# Patient Record
Sex: Female | Born: 1975 | Race: Black or African American | Hispanic: No | Marital: Single | Smoking: Former smoker
Health system: Southern US, Community
[De-identification: ages and names within clinical notes are randomized; demographics above are authoritative.]

## PROBLEM LIST (undated history)

## (undated) DIAGNOSIS — E669 Obesity, unspecified: Secondary | ICD-10-CM

## (undated) DIAGNOSIS — Z5189 Encounter for other specified aftercare: Secondary | ICD-10-CM

## (undated) DIAGNOSIS — J45909 Unspecified asthma, uncomplicated: Secondary | ICD-10-CM

## (undated) DIAGNOSIS — R42 Dizziness and giddiness: Secondary | ICD-10-CM

## (undated) DIAGNOSIS — M79604 Pain in right leg: Secondary | ICD-10-CM

## (undated) DIAGNOSIS — J189 Pneumonia, unspecified organism: Secondary | ICD-10-CM

## (undated) DIAGNOSIS — K219 Gastro-esophageal reflux disease without esophagitis: Secondary | ICD-10-CM

## (undated) DIAGNOSIS — T7840XA Allergy, unspecified, initial encounter: Secondary | ICD-10-CM

## (undated) DIAGNOSIS — J069 Acute upper respiratory infection, unspecified: Secondary | ICD-10-CM

## (undated) DIAGNOSIS — M1990 Unspecified osteoarthritis, unspecified site: Secondary | ICD-10-CM

## (undated) HISTORY — PX: FRACTURE SURGERY: SHX138

## (undated) HISTORY — DX: Unspecified asthma, uncomplicated: J45.909

## (undated) HISTORY — DX: Encounter for other specified aftercare: Z51.89

## (undated) HISTORY — DX: Gastro-esophageal reflux disease without esophagitis: K21.9

## (undated) HISTORY — DX: Unspecified osteoarthritis, unspecified site: M19.90

## (undated) HISTORY — DX: Dizziness and giddiness: R42

## (undated) HISTORY — DX: Obesity, unspecified: E66.9

## (undated) HISTORY — DX: Allergy, unspecified, initial encounter: T78.40XA

## (undated) HISTORY — DX: Acute upper respiratory infection, unspecified: J06.9

## (undated) HISTORY — DX: Pain in right leg: M79.604

## (undated) HISTORY — PX: ADENOIDECTOMY: SUR15

---

## 1996-04-15 HISTORY — PX: HARDWARE REMOVAL: SHX979

## 1997-08-02 ENCOUNTER — Ambulatory Visit (HOSPITAL_COMMUNITY): Admission: RE | Admit: 1997-08-02 | Discharge: 1997-08-02 | Payer: Self-pay | Admitting: Obstetrics

## 1997-08-16 ENCOUNTER — Ambulatory Visit (HOSPITAL_COMMUNITY): Admission: RE | Admit: 1997-08-16 | Discharge: 1997-08-16 | Payer: Self-pay | Admitting: Obstetrics

## 1997-10-13 ENCOUNTER — Other Ambulatory Visit: Admission: RE | Admit: 1997-10-13 | Discharge: 1997-10-13 | Payer: Self-pay | Admitting: Obstetrics

## 1997-10-28 ENCOUNTER — Inpatient Hospital Stay (HOSPITAL_COMMUNITY): Admission: AD | Admit: 1997-10-28 | Discharge: 1997-10-31 | Payer: Self-pay | Admitting: Obstetrics

## 1997-10-28 ENCOUNTER — Inpatient Hospital Stay (HOSPITAL_COMMUNITY): Admission: AD | Admit: 1997-10-28 | Discharge: 1997-10-28 | Payer: Self-pay | Admitting: Obstetrics

## 1999-05-09 ENCOUNTER — Other Ambulatory Visit: Admission: RE | Admit: 1999-05-09 | Discharge: 1999-05-09 | Payer: Self-pay | Admitting: Obstetrics & Gynecology

## 1999-11-13 ENCOUNTER — Inpatient Hospital Stay (HOSPITAL_COMMUNITY): Admission: AD | Admit: 1999-11-13 | Discharge: 1999-11-15 | Payer: Self-pay | Admitting: Obstetrics and Gynecology

## 1999-11-13 ENCOUNTER — Inpatient Hospital Stay (HOSPITAL_COMMUNITY): Admission: AD | Admit: 1999-11-13 | Discharge: 1999-11-13 | Payer: Self-pay | Admitting: Obstetrics & Gynecology

## 2002-01-27 ENCOUNTER — Other Ambulatory Visit: Admission: RE | Admit: 2002-01-27 | Discharge: 2002-01-27 | Payer: Self-pay | Admitting: Obstetrics and Gynecology

## 2003-04-11 ENCOUNTER — Other Ambulatory Visit: Admission: RE | Admit: 2003-04-11 | Discharge: 2003-04-11 | Payer: Self-pay | Admitting: Obstetrics and Gynecology

## 2003-09-07 ENCOUNTER — Other Ambulatory Visit: Admission: RE | Admit: 2003-09-07 | Discharge: 2003-09-07 | Payer: Self-pay | Admitting: Obstetrics and Gynecology

## 2003-11-07 ENCOUNTER — Ambulatory Visit (HOSPITAL_COMMUNITY): Admission: RE | Admit: 2003-11-07 | Discharge: 2003-11-07 | Payer: Self-pay | Admitting: Obstetrics and Gynecology

## 2003-11-13 ENCOUNTER — Inpatient Hospital Stay (HOSPITAL_COMMUNITY): Admission: AD | Admit: 2003-11-13 | Discharge: 2003-11-18 | Payer: Self-pay | Admitting: Obstetrics and Gynecology

## 2005-06-24 ENCOUNTER — Other Ambulatory Visit: Admission: RE | Admit: 2005-06-24 | Discharge: 2005-06-24 | Payer: Self-pay | Admitting: Obstetrics and Gynecology

## 2010-11-22 ENCOUNTER — Other Ambulatory Visit: Payer: Self-pay | Admitting: Obstetrics and Gynecology

## 2012-12-12 ENCOUNTER — Emergency Department (HOSPITAL_COMMUNITY)
Admission: EM | Admit: 2012-12-12 | Discharge: 2012-12-12 | Disposition: A | Discharge status: Home / Self Care | Payer: BC Managed Care – PPO | Attending: Emergency Medicine | Admitting: Emergency Medicine

## 2012-12-12 ENCOUNTER — Other Ambulatory Visit: Payer: Self-pay

## 2012-12-12 ENCOUNTER — Encounter (HOSPITAL_COMMUNITY): Payer: Self-pay

## 2012-12-12 ENCOUNTER — Emergency Department (HOSPITAL_COMMUNITY): Payer: BC Managed Care – PPO

## 2012-12-12 DIAGNOSIS — Z79899 Other long term (current) drug therapy: Secondary | ICD-10-CM | POA: Insufficient documentation

## 2012-12-12 DIAGNOSIS — R55 Syncope and collapse: Secondary | ICD-10-CM

## 2012-12-12 DIAGNOSIS — R51 Headache: Secondary | ICD-10-CM | POA: Insufficient documentation

## 2012-12-12 DIAGNOSIS — Z3202 Encounter for pregnancy test, result negative: Secondary | ICD-10-CM | POA: Insufficient documentation

## 2012-12-12 DIAGNOSIS — R42 Dizziness and giddiness: Secondary | ICD-10-CM | POA: Insufficient documentation

## 2012-12-12 DIAGNOSIS — F17200 Nicotine dependence, unspecified, uncomplicated: Secondary | ICD-10-CM | POA: Insufficient documentation

## 2012-12-12 DIAGNOSIS — N390 Urinary tract infection, site not specified: Secondary | ICD-10-CM | POA: Insufficient documentation

## 2012-12-12 LAB — CBC WITH DIFFERENTIAL/PLATELET
Basophils Relative: 0 % (ref 0–1)
HCT: 36.8 % (ref 36.0–46.0)
Hemoglobin: 12.1 g/dL (ref 12.0–15.0)
Lymphocytes Relative: 25 % (ref 12–46)
Monocytes Absolute: 0.6 10*3/uL (ref 0.1–1.0)
Monocytes Relative: 5 % (ref 3–12)
Neutrophils Relative %: 69 % (ref 43–77)
Platelets: DECREASED 10*3/uL (ref 150–400)
RBC: 4.34 MIL/uL (ref 3.87–5.11)

## 2012-12-12 LAB — COMPREHENSIVE METABOLIC PANEL
ALT: 14 U/L (ref 0–35)
AST: 19 U/L (ref 0–37)
BUN: 9 mg/dL (ref 6–23)
CO2: 20 mEq/L (ref 19–32)
Creatinine, Ser: 0.96 mg/dL (ref 0.50–1.10)
GFR calc Af Amer: 87 mL/min — ABNORMAL LOW (ref >90)
GFR calc non Af Amer: 75 mL/min — ABNORMAL LOW (ref >90)
Glucose, Bld: 103 mg/dL — ABNORMAL HIGH (ref 70–99)
Potassium: 3.7 mEq/L (ref 3.5–5.1)
Total Protein: 7.1 g/dL (ref 6.0–8.3)

## 2012-12-12 LAB — URINE MICROSCOPIC-ADD ON

## 2012-12-12 LAB — D-DIMER, QUANTITATIVE: D-Dimer, Quant: 0.54 ug/mL-FEU — ABNORMAL HIGH (ref 0.00–0.48)

## 2012-12-12 LAB — URINALYSIS, ROUTINE W REFLEX MICROSCOPIC
Bilirubin Urine: NEGATIVE
Glucose, UA: NEGATIVE mg/dL
Nitrite: POSITIVE — AB
Protein, ur: NEGATIVE mg/dL

## 2012-12-12 LAB — POCT PREGNANCY, URINE: Preg Test, Ur: NEGATIVE

## 2012-12-12 MED ORDER — IOHEXOL 350 MG/ML SOLN
100.0000 mL | Freq: Once | INTRAVENOUS | Status: AC | PRN
  Administered 2012-12-12: 100 mL via INTRAVENOUS

## 2012-12-12 MED ORDER — CIPROFLOXACIN HCL 500 MG PO TABS
250.0000 mg | ORAL_TABLET | Freq: Once | ORAL | Status: AC
  Administered 2012-12-12: 250 mg via ORAL
  Filled 2012-12-12: qty 1

## 2012-12-12 MED ORDER — CIPROFLOXACIN HCL 500 MG PO TABS: 250.0000 mg | ORAL_TABLET | Freq: Two times a day (BID) | ORAL | Status: DC

## 2012-12-12 MED ORDER — SODIUM CHLORIDE 0.9 % IV SOLN
1000.0000 mL | Freq: Once | INTRAVENOUS | Status: AC
  Administered 2012-12-12: 1000 mL via INTRAVENOUS

## 2012-12-12 MED ORDER — ACETAMINOPHEN 325 MG PO TABS
975.0000 mg | ORAL_TABLET | Freq: Once | ORAL | Status: AC
  Administered 2012-12-12: 975 mg via ORAL
  Filled 2012-12-12: qty 3

## 2012-12-12 NOTE — ED Provider Notes (Signed)
Medical screening examination/treatment/procedure(s) were performed by non-physician practitioner and as supervising physician I was immediately available for consultation/collaboration. Devoria Albe, MD, Armando Gang   Ward Givens, MD 12/12/12 2038

## 2012-12-12 NOTE — ED Notes (Signed)
Bed: ZO10 Expected date: 12/12/12 Expected time: 4:07 PM Means of arrival: Ambulance Comments: EMS-Syncopal Episode

## 2012-12-12 NOTE — ED Provider Notes (Signed)
CSN: [id number]     Arrival date & time 12/12/12  1623 History   First MD Initiated Contact with Patient 12/12/12 1627     Chief Complaint  Patient presents with  . Near Syncope   (Consider location/radiation/quality/duration/timing/severity/associated sxs/prior Treatment) HPI  Rebecca Simon is a 37 y.o. female who is otherwise healthy brought in for presyncope patient was watching her son play football (it is a very hot day today and (she began to feel dizzy, had dark spots in her field of vision grabbed onto a fence and was lowered to the ground by bystanders. Patient remembers all events and did not loose consciousness. Patient denies chest pain or shortness of breath, family history of early cardiac death, and history of syncopal events. Patient has no history of DVT or PE, no history of recent immobilizations for leg swelling or pain. States that she has been eating and drinking normally, had one bottle of water and 1 Gatorade today. Patient reports a mild diffuse headache rated at 5/10, she denies cervicalgia, photo or phonophobia, headache is typical of prior exacerbations.  No past medical history on file. No past surgical history on file. No family history on file. History  Substance Use Topics  . Smoking status: Not on file  . Smokeless tobacco: Not on file  . Alcohol Use: Not on file   OB History   No data available     Review of Systems 10 systems reviewed and found to be negative, except as noted in the HPI  Allergies  Sulfa antibiotics and Vicodin  Home Medications   Current Outpatient Rx  Name  Route  Sig  Dispense  Refill  . naproxen sodium (ANAPROX) 220 MG tablet   Oral   Take 440 mg by mouth 2 (two) times daily with a meal.         . Norethindrone-Ethinyl Estradiol-Fe Biphas (LO LOESTRIN FE) 1 MG-10 MCG / 10 MCG tablet   Oral   Take 1 tablet by mouth every evening.          BP 121/74  Pulse 97  Temp(Src) 98.7 F (37.1 C) (Oral)  Resp 16  SpO2  100%  LMP 12/05/2012 Physical Exam  Nursing note and vitals reviewed. Constitutional: She is oriented to person, place, and time. She appears well-developed and well-nourished. No distress.  HENT:  Head: Normocephalic.  No conjunctival pallor  Eyes: Conjunctivae and EOM are normal. Pupils are equal, round, and reactive to light.  Neck: Normal range of motion. Neck supple.  No midline tenderness to palpation or step-offs appreciated. Patient has full range of motion without pain.   Cardiovascular: Normal rate, regular rhythm and intact distal pulses.   Pulmonary/Chest: Effort normal and breath sounds normal. No stridor. No respiratory distress. She has no wheezes. She has no rales. She exhibits no tenderness.  Abdominal: Soft. Bowel sounds are normal. She exhibits no distension and no mass. There is no tenderness. There is no rebound and no guarding.  Musculoskeletal: Normal range of motion. She exhibits no edema.  No calf asymmetry, superficial collaterals, palpable cords, edema, Homans sign negative bilaterally.    Neurological: She is alert and oriented to person, place, and time.  Skin: Skin is warm.  Psychiatric: She has a normal mood and affect.    ED Course  Procedures (including critical care time) Labs Review Labs Reviewed  CBC WITH DIFFERENTIAL - Abnormal; Notable for the following:    WBC 11.2 (*)    All other components within  normal limits  COMPREHENSIVE METABOLIC PANEL - Abnormal; Notable for the following:    Glucose, Bld 103 (*)    Albumin 3.1 (*)    GFR calc non Af Amer 75 (*)    GFR calc Af Amer 87 (*)    All other components within normal limits  URINALYSIS, ROUTINE W REFLEX MICROSCOPIC - Abnormal; Notable for the following:    APPearance CLOUDY (*)    Hgb urine dipstick SMALL (*)    Nitrite POSITIVE (*)    Leukocytes, UA SMALL (*)    All other components within normal limits  URINE MICROSCOPIC-ADD ON - Abnormal; Notable for the following:    Bacteria, UA  MANY (*)    All other components within normal limits  D-DIMER, QUANTITATIVE - Abnormal; Notable for the following:    D-Dimer, Quant 0.54 (*)    All other components within normal limits  URINE CULTURE  POCT PREGNANCY, URINE  POCT CBG (FASTING - GLUCOSE)-MANUAL ENTRY   Imaging Review Ct Angio Chest Pe W/cm &/or Wo Cm  12/12/2012   CLINICAL DATA:  Syncope. Elevated D-dimer  EXAM: CT ANGIOGRAPHY CHEST WITH CONTRAST  TECHNIQUE: Multidetector CT imaging of the chest was performed using the standard protocol during bolus administration of intravenous contrast. Multiplanar CT image reconstructions including MIPs were obtained to evaluate the vascular anatomy.  CONTRAST:  OMNIPAQUE IOHEXOL 350 MG/ML SOLN  COMPARISON:  None.  FINDINGS: No filling defects in the pulmonary arteries to suggest pulmonary emboli. Heart is normal size. Aorta is normal caliber. No mediastinal, hilar, or axillary adenopathy. Visualized thyroid and chest wall soft tissues unremarkable. Imaging into the upper abdomen shows no acute findings.  Review of the MIP images confirms the above findings.  IMPRESSION: Negative   Electronically Signed   By: Charlett Nose   On: 12/12/2012 20:02     Date: 12/12/2012  Rate: 97  Rhythm: normal sinus rhythm  QRS Axis: normal  Intervals: normal  ST/T Wave abnormalities: nonspecific T wave changes T-wave inversions in lead III and aVF  Conduction Disutrbances:none  Narrative Interpretation:   Old EKG Reviewed: unchanged  MDM   1. Pre-syncope   2. UTI (lower urinary tract infection)   3. Tobacco use disorder    Filed Vitals:   12/12/12 1637 12/12/12 1712 12/12/12 1713 12/12/12 1714  BP: 133/78 121/76 123/82 121/74  Pulse: 95 81 84 97  Temp: 98.7 F (37.1 C)     TempSrc: Oral     Resp: 16     SpO2: 100%        Rebecca Simon is a 37 y.o. female with near syncopal event while outside in a very hot day. Patient has no family history of early cardiac death. This is likely  secondary to dehydration. Patient is orthostatic by both vital signs and symptoms. D-dimer is ordered because she has risk factors of hormonal birth control in addition to cigarette smoking.   D-dimer is elevated, however CT PE is negative.   Urinalysis looks infected, however patient has no dysuria or frequency. I will start her on a short course of Cipro. Urine culture pending. Negative CVA tenderness to palpation bilaterally.   Counseled patient on smoking cessation.   Medications  0.9 %  sodium chloride infusion (0 mLs Intravenous Stopped 12/12/12 1915)  acetaminophen (TYLENOL) tablet 975 mg (975 mg Oral Given 12/12/12 1824)  ciprofloxacin (CIPRO) tablet 250 mg (250 mg Oral Given 12/12/12 1825)  0.9 %  sodium chloride infusion (1,000 mLs Intravenous New  Bag/Given 12/12/12 1825)  iohexol (OMNIPAQUE) 350 MG/ML injection 100 mL (100 mLs Intravenous Contrast Given 12/12/12 1930)    Pt is hemodynamically stable, appropriate for, and amenable to discharge at this time. Pt verbalized understanding and agrees with care plan. All questions answered. Outpatient follow-up and specific return precautions discussed.    New Prescriptions   CIPROFLOXACIN (CIPRO) 500 MG TABLET    Take 0.5 tablets (250 mg total) by mouth 2 (two) times daily.    Note: Portions of this report may have been transcribed using voice recognition software. Every effort was made to ensure accuracy; however, inadvertent computerized transcription errors may be present      Wynetta Emery, PA-C 12/12/12 2019

## 2012-12-12 NOTE — ED Notes (Signed)
Pt ambulated from room to bathroom with no difficulties. Complain of no dizziness or lightheadedness

## 2012-12-12 NOTE — ED Notes (Signed)
Per EMS felt faint while watching son play football. Patient grabbed on to a fence. Remembers feeling weak, had black spots. Bystanders helped her down. No hx of snycope. cbg 106, 112/78, 94, 98%

## 2012-12-15 LAB — URINE CULTURE

## 2012-12-15 LAB — POCT I-STAT TROPONIN I

## 2014-01-15 ENCOUNTER — Encounter (HOSPITAL_COMMUNITY): Payer: Self-pay | Admitting: Emergency Medicine

## 2014-01-15 ENCOUNTER — Emergency Department (HOSPITAL_COMMUNITY)
Admission: EM | Admit: 2014-01-15 | Discharge: 2014-01-15 | Disposition: A | Discharge status: Home / Self Care | Payer: BC Managed Care – PPO | Source: Home / Self Care | Attending: Emergency Medicine | Admitting: Emergency Medicine

## 2014-01-15 DIAGNOSIS — H8111 Benign paroxysmal vertigo, right ear: Secondary | ICD-10-CM

## 2014-01-15 MED ORDER — MECLIZINE HCL 25 MG PO TABS: 25.0000 mg | ORAL_TABLET | Freq: Three times a day (TID) | ORAL | Status: DC | PRN

## 2014-01-15 NOTE — Discharge Instructions (Signed)
You have been diagnosed with benign positional vertigo.  The cause of this is a displaced particle or crystal in the inner ear.  This can be cured by doing the exercise described below, called the Epley exercise developed by Dr. Epley. ° °Do this exercise three times daily until you are able to do it for 24 hours without getting dizzy.  Sleep with your head elevated and try to avoid bending over.  If the dizziness is not better in 7 days, return here, see your primary care doctor or ENT doctor. ° ° ° ° ° °

## 2014-01-15 NOTE — ED Provider Notes (Signed)
Chief Complaint   Dizziness   History of Present Illness   Rebecca Simon is a 38 year old female who experienced sudden onset of spinning vertigo shortly after getting up this morning. The vertigo is continuous for several minutes, but since then it's been intermittent. Is worse if she lies completely flat or gets up. It's been coming by some nausea but no vomiting. She denies any difficulty hearing, ear pain, ringing in ears. She's had no nasal congestion or drainage. She denies headache, diplopia, blurry vision. She's had no neck pain or stiffness. She denies any chest pain, shortness of breath, although she has had occasional palpitations. She denies any paresthesias, numbness, tingling, weakness, or difficulty with ambulation, speech, coordination, or balance.  Review of Systems   Other than noted above, the patient denies any of the following symptoms: Systemic:  No fever, chills, fatigue, or weight loss. Eye:  No blurred vision, visual change or diplopia. ENT:  No ear pain, tinnitus, hearing loss, nasal congestion, or rhinorrhea. Cardiac:  No chest pain, dyspnea, palpitations or syncope. Neuro:  No headache, paresthesias, weakness, trouble with speech, coordination or ambulation.  PMFSH   Past medical history, family history, social history, meds, and allergies were reviewed.  She is allergic to Vicodin and sulfa. She takes birth control pills. Last menstrual period was September 19.  Physical Examination     Vital signs:  BP 115/67  Pulse 72  Temp(Src) 98.7 F (37.1 C) (Oral)  Resp 18  SpO2 97%  LMP 01/01/2014 General:  Alert, oriented times 3, in no distress. Eye:  PERRL, full EOM, no nystagmus. ENT:  TMs and canals normal.  Nasal mucosa normal.  Pharynx clear. Neck:  No adenopathy, tenderness, or mass.  Thyroid normal.  No carotid bruit. Lungs:  Breath sounds clear and equal bilaterally.  No wheezes, rales or rhonchi. Heart:  Regular rhythm.  No gallops, murmers, or  rubs. Neuro:  Alert and oriented times 3.  Cranial nerves intact.  No pronator drift.  Finger to nose normal.  No focal weakness.  Sensation intact to light touch.  Romberg's sign negative, gait normal.  Able to do tandem gait well.  Dix-Hallpike maneuver was positive with the right ear down.  Course in Urgent Care Center   The Epley maneuver was repeated twice. The patient still has some vertigo with this maneuver.  Assessment   The encounter diagnosis was Benign positional vertigo, right.  No evidence of presyncope or central vertigo.    Plan     1.  Meds:  The following meds were prescribed:   Discharge Medication List as of 01/15/2014 11:16 AM    START taking these medications   Details  meclizine (ANTIVERT) 25 MG tablet Take 1 tablet (25 mg total) by mouth 3 (three) times daily as needed for dizziness., Starting 01/15/2014, Until Discontinued, Normal        2.  Patient Education/Counseling:  The patient was given appropriate handouts, self care instructions, and instructed in symptomatic relief.  Instructed to perform the Epley maneuver 3 times a day until she can do for a day without getting dizzy. Instructed not to drive until the dizziness has gone away completely.  3.  Follow up:  The patient was told to follow up here if no better in 3 to 4 days, or sooner if becoming worse in any way, and given some red flag symptoms such as new neurological symptoms, chest pain, or syncope which would prompt immediate return.  Return to see Dr.  Teoh if no better in one week.     Reuben Likes, MD 01/15/14 [id number]

## 2014-01-15 NOTE — ED Notes (Signed)
Reports dizziness that started this morning.  Initially dizziness was constant, now it is intermittent.  Reports dizziness as "room is spinning" and associated with nausea.  Recently has had sinus drainage.  No chest pain

## 2014-03-14 ENCOUNTER — Other Ambulatory Visit: Payer: Self-pay | Admitting: Obstetrics & Gynecology

## 2014-03-15 LAB — CYTOLOGY - PAP

## 2014-04-20 ENCOUNTER — Ambulatory Visit: Payer: BC Managed Care – PPO | Admitting: Family

## 2014-08-03 ENCOUNTER — Ambulatory Visit (INDEPENDENT_AMBULATORY_CARE_PROVIDER_SITE_OTHER): Payer: BLUE CROSS/BLUE SHIELD | Admitting: Family Medicine

## 2014-08-03 ENCOUNTER — Encounter: Payer: Self-pay | Admitting: Family Medicine

## 2014-08-03 VITALS — BP 96/70 | HR 75 | Temp 98.3°F | Ht 61.25 in | Wt 186.5 lb

## 2014-08-03 DIAGNOSIS — Z1329 Encounter for screening for other suspected endocrine disorder: Secondary | ICD-10-CM | POA: Diagnosis not present

## 2014-08-03 DIAGNOSIS — Z131 Encounter for screening for diabetes mellitus: Secondary | ICD-10-CM | POA: Diagnosis not present

## 2014-08-03 DIAGNOSIS — J302 Other seasonal allergic rhinitis: Secondary | ICD-10-CM

## 2014-08-03 DIAGNOSIS — Z23 Encounter for immunization: Secondary | ICD-10-CM

## 2014-08-03 DIAGNOSIS — E669 Obesity, unspecified: Secondary | ICD-10-CM | POA: Diagnosis not present

## 2014-08-03 DIAGNOSIS — K219 Gastro-esophageal reflux disease without esophagitis: Secondary | ICD-10-CM

## 2014-08-03 LAB — TSH: TSH: 1.71 u[IU]/mL (ref 0.35–4.50)

## 2014-08-03 LAB — LIPID PANEL
Cholesterol: 170 mg/dL (ref 0–200)
HDL: 50.4 mg/dL (ref >39.00)
LDL Cholesterol: 105 mg/dL — ABNORMAL HIGH (ref 0–99)
NONHDL: 119.6
Total CHOL/HDL Ratio: 3
Triglycerides: 74 mg/dL (ref 0.0–149.0)
VLDL: 14.8 mg/dL (ref 0.0–40.0)

## 2014-08-03 LAB — BASIC METABOLIC PANEL
BUN: 8 mg/dL (ref 6–23)
CO2: 26 mEq/L (ref 19–32)
CREATININE: 0.93 mg/dL (ref 0.40–1.20)
Calcium: 9.4 mg/dL (ref 8.4–10.5)
Chloride: 108 mEq/L (ref 96–112)
GFR: 86.22 mL/min (ref >60.00)
Glucose, Bld: 92 mg/dL (ref 70–99)
Potassium: 4.7 mEq/L (ref 3.5–5.1)
Sodium: 140 mEq/L (ref 135–145)

## 2014-08-03 LAB — HEMOGLOBIN A1C: HEMOGLOBIN A1C: 5.3 % (ref 4.6–6.5)

## 2014-08-03 NOTE — Progress Notes (Signed)
Pre visit review using our clinic review tool, if applicable. No additional management support is needed unless otherwise documented below in the visit note. 
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
## 2014-08-03 NOTE — Patient Instructions (Addendum)
BEFORE YOU LEAVE: -Tdap -FASTING labs -follow up in 6 month or as needed  We recommend the following healthy lifestyle measures: - eat a healthy diet consisting of lots of vegetables, fruits, beans, nuts, seeds, healthy meats such as white chicken and fish and whole grains.  - avoid fried foods, fast food, processed foods, sodas, red meet and other fattening foods.  - get a least 150 minutes of aerobic exercise per week.    Gastroesophageal Reflux Disease, Adult Gastroesophageal reflux disease (GERD) happens when acid from your stomach flows up into the esophagus. When acid comes in contact with the esophagus, the acid causes soreness (inflammation) in the esophagus. Over time, GERD may create small holes (ulcers) in the lining of the esophagus. CAUSES   Increased body weight. This puts pressure on the stomach, making acid rise from the stomach into the esophagus.  Smoking. This increases acid production in the stomach.  Drinking alcohol. This causes decreased pressure in the lower esophageal sphincter (valve or ring of muscle between the esophagus and stomach), allowing acid from the stomach into the esophagus.  Late evening meals and a full stomach. This increases pressure and acid production in the stomach.  A malformed lower esophageal sphincter. Sometimes, no cause is found. SYMPTOMS   Burning pain in the lower part of the mid-chest behind the breastbone and in the mid-stomach area. This may occur twice a week or more often.  Trouble swallowing.  Sore throat.  Dry cough.  Asthma-like symptoms including chest tightness, shortness of breath, or wheezing. DIAGNOSIS  Your caregiver may be able to diagnose GERD based on your symptoms. In some cases, X-rays and other tests may be done to check for complications or to check the condition of your stomach and esophagus. TREATMENT  Your caregiver may recommend over-the-counter or prescription medicines to help decrease acid  production. Ask your caregiver before starting or adding any new medicines.  HOME CARE INSTRUCTIONS   Change the factors that you can control. Ask your caregiver for guidance concerning weight loss, quitting smoking, and alcohol consumption.  Avoid foods and drinks that make your symptoms worse, such as:  Caffeine or alcoholic drinks.  Chocolate.  Peppermint or mint flavorings.  Garlic and onions.  Spicy foods.  Citrus fruits, such as oranges, lemons, or limes.  Tomato-based foods such as sauce, chili, salsa, and pizza.  Fried and fatty foods.  Avoid lying down for the 3 hours prior to your bedtime or prior to taking a nap.  Eat small, frequent meals instead of large meals.  Wear loose-fitting clothing. Do not wear anything tight around your waist that causes pressure on your stomach.  Raise the head of your bed 6 to 8 inches with wood blocks to help you sleep. Extra pillows will not help.  Only take over-the-counter or prescription medicines for pain, discomfort, or fever as directed by your caregiver.  Do not take aspirin, ibuprofen, or other nonsteroidal anti-inflammatory drugs (NSAIDs). SEEK IMMEDIATE MEDICAL CARE IF:   You have pain in your arms, neck, jaw, teeth, or back.  Your pain increases or changes in intensity or duration.  You develop nausea, vomiting, or sweating (diaphoresis).  You develop shortness of breath, or you faint.  Your vomit is green, yellow, black, or looks like coffee grounds or blood.  Your stool is red, bloody, or black. These symptoms could be signs of other problems, such as heart disease, gastric bleeding, or esophageal bleeding. MAKE SURE YOU:   Understand these instructions.  Will  watch your condition.  Will get help right away if you are not doing well or get worse. Document Released: 01/09/2005 Document Revised: 06/24/2011 Document Reviewed: 10/19/2010 [facility] Patient Information 2015 [place], [place]. This information is  not intended to replace advice given to you by your health care provider. Make sure you discuss any questions you have with your health care provider.

## 2014-08-03 NOTE — Addendum Note (Signed)
Addended by: Johnella MoloneyFUNDERBURK, Gen Clagg A on: 08/03/2014 11:46 AM   Modules accepted: Orders

## 2014-08-29 ENCOUNTER — Encounter: Payer: Self-pay | Admitting: Family Medicine

## 2014-08-29 ENCOUNTER — Ambulatory Visit (INDEPENDENT_AMBULATORY_CARE_PROVIDER_SITE_OTHER)
Admission: RE | Admit: 2014-08-29 | Discharge: 2014-08-29 | Disposition: A | Discharge status: Home / Self Care | Payer: BLUE CROSS/BLUE SHIELD | Source: Ambulatory Visit | Attending: Family Medicine | Admitting: Family Medicine

## 2014-08-29 ENCOUNTER — Ambulatory Visit (INDEPENDENT_AMBULATORY_CARE_PROVIDER_SITE_OTHER): Payer: BLUE CROSS/BLUE SHIELD | Admitting: Family Medicine

## 2014-08-29 VITALS — BP 100/68 | HR 69 | Temp 98.6°F | Ht 61.25 in | Wt 186.1 lb

## 2014-08-29 DIAGNOSIS — M79671 Pain in right foot: Secondary | ICD-10-CM

## 2014-08-29 DIAGNOSIS — R69 Illness, unspecified: Secondary | ICD-10-CM

## 2014-08-29 NOTE — Progress Notes (Signed)
No show
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
## 2014-08-29 NOTE — Patient Instructions (Signed)
BEFORE YOU LEAVE: -ankle sprain exercises -xray sheet -follow up in 1 month   Wear good supportive shoes such as tennis shoes with a good arch support  Do the exercises 3 days per week

## 2014-08-29 NOTE — Addendum Note (Signed)
Addended by: Terressa KoyanagiKIM, Ruie Sendejo R on: 08/29/2014 04:32 PM   Modules accepted: Orders

## 2014-08-29 NOTE — Progress Notes (Signed)
Pre visit review using our clinic review tool, if applicable. No additional management support is needed unless otherwise documented below in the visit note. 

## 2014-09-29 ENCOUNTER — Encounter: Payer: Self-pay | Admitting: Family Medicine

## 2014-09-29 ENCOUNTER — Ambulatory Visit (INDEPENDENT_AMBULATORY_CARE_PROVIDER_SITE_OTHER): Payer: BLUE CROSS/BLUE SHIELD | Admitting: Family Medicine

## 2014-09-29 VITALS — BP 108/70 | HR 77 | Temp 98.6°F | Ht 61.25 in | Wt 188.8 lb

## 2014-09-29 DIAGNOSIS — M79671 Pain in right foot: Secondary | ICD-10-CM | POA: Diagnosis not present

## 2014-09-29 DIAGNOSIS — J302 Other seasonal allergic rhinitis: Secondary | ICD-10-CM | POA: Diagnosis not present

## 2014-09-29 NOTE — Progress Notes (Signed)
Pre visit review using our clinic review tool, if applicable. No additional management support is needed unless otherwise documented below in the visit note. 

## 2014-09-29 NOTE — Patient Instructions (Signed)
Try flonase 2 sprays each nostril for 21 days in the morning, then 1 spray each nostril and a zyrtec nightly  Follow up as needed
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
## 2014-10-07 IMAGING — CT CT ANGIO CHEST
2 of 3 series · 20 of 33 positions shown · IV contrast (OMNIPAQUE 350)
Comparison: None.

CLINICAL DATA: Syncope. Elevated D-dimer

EXAM:
CT ANGIOGRAPHY CHEST WITH CONTRAST
TECHNIQUE: Multidetector CT imaging of the chest was performed using the
standard protocol during bolus administration of intravenous
contrast. Multiplanar CT image reconstructions including MIPs were
obtained to evaluate the vascular anatomy.
CONTRAST:  100mL OMNIPAQUE IOHEXOL 350 MG/ML SOLN

[Series 8: thins for pacs · axial · 0.62mm/px · z∈[-253,-60]mm · 18 of 221 slices shown]
[im 14/221  lung]
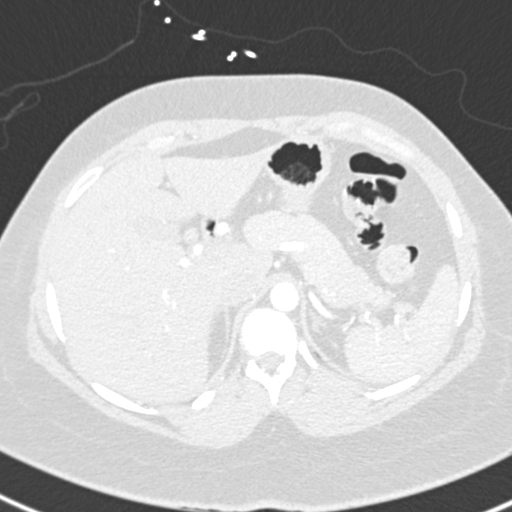
[im 28/221  mediastinal]
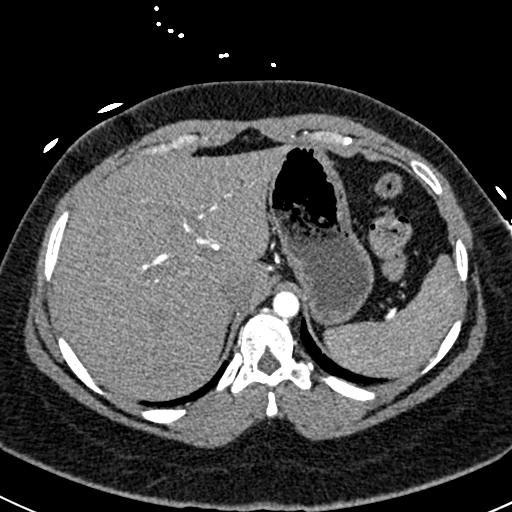
[im 42/221  lung]
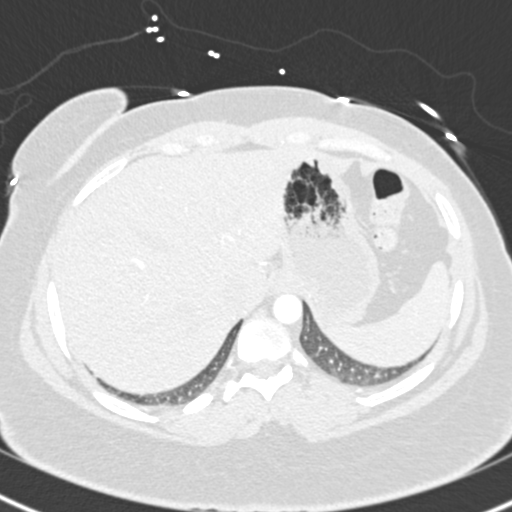
[im 56/221  mediastinal]
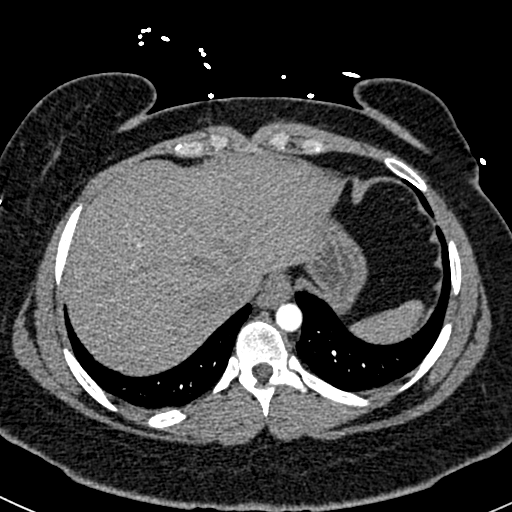
[im 69/221  lung]
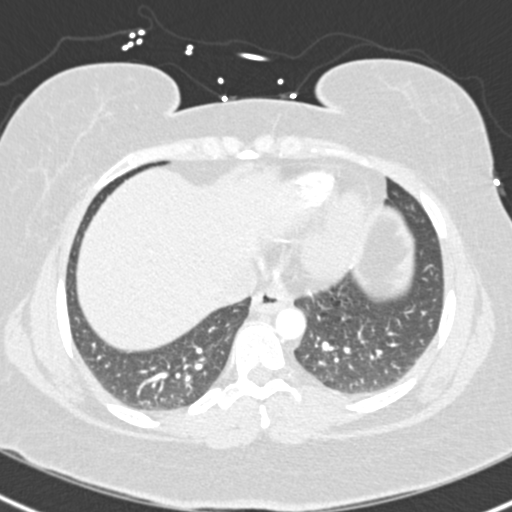
[im 74/221  mediastinal]
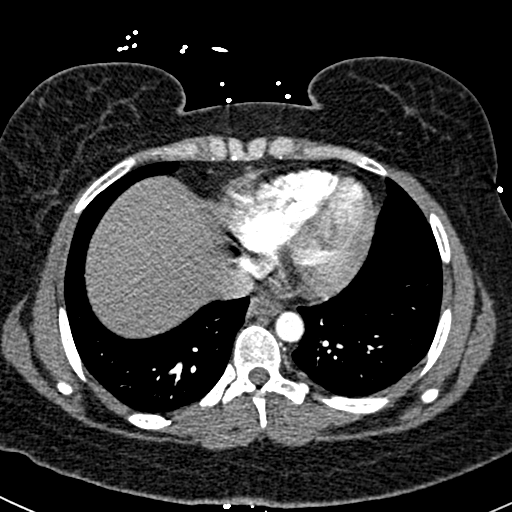
[im 83/221  lung]
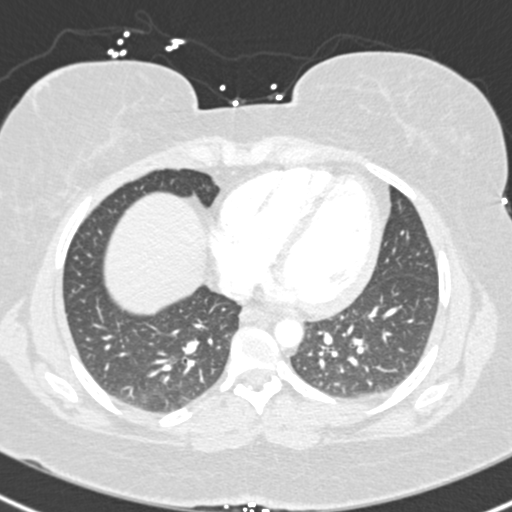
[im 97/221  mediastinal]
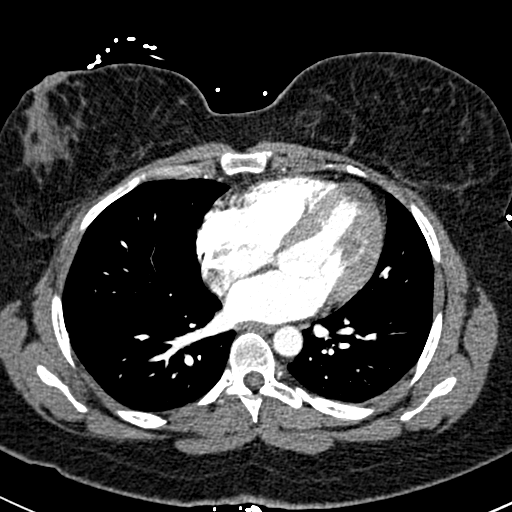
[im 101/221  lung]
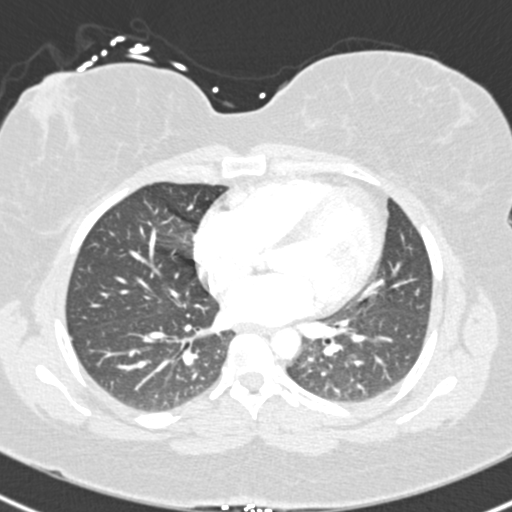
[im 111/221  mediastinal]
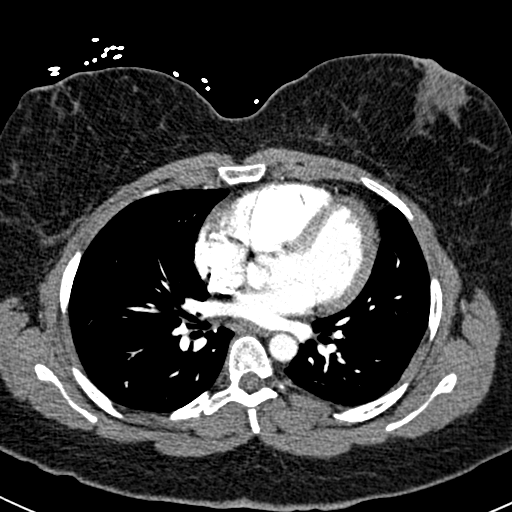
[im 124/221  lung]
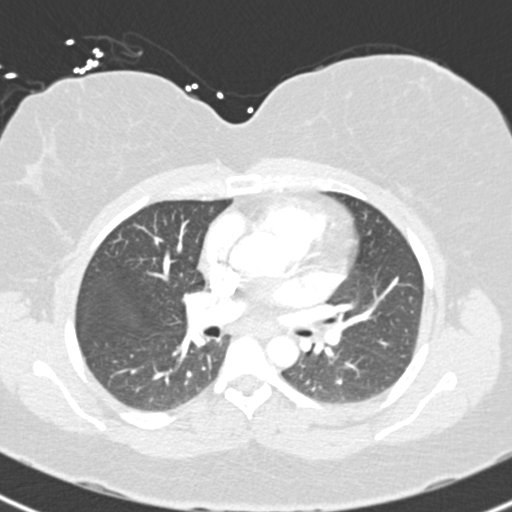
[im 138/221  mediastinal]
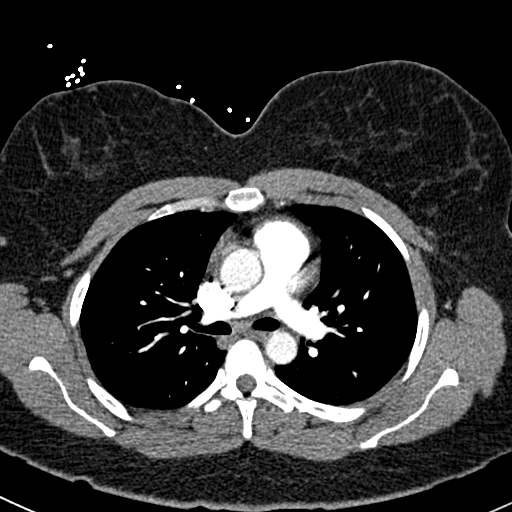
[im 147/221  lung]
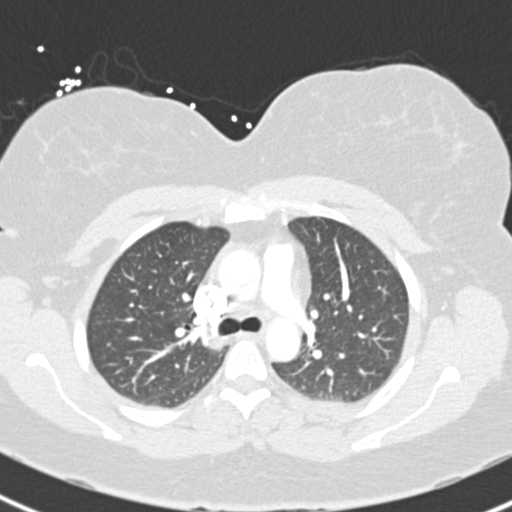
[im 152/221  mediastinal]
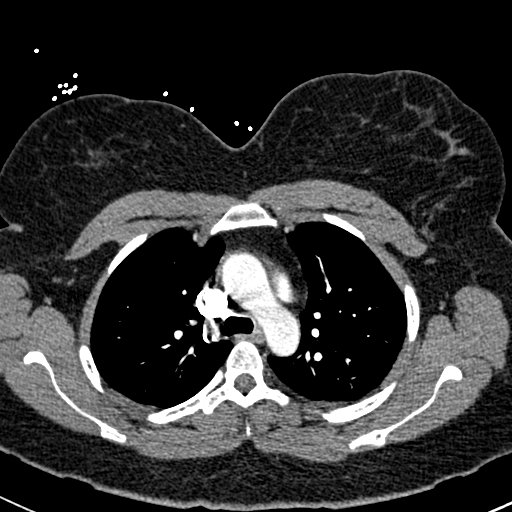
[im 166/221  lung]
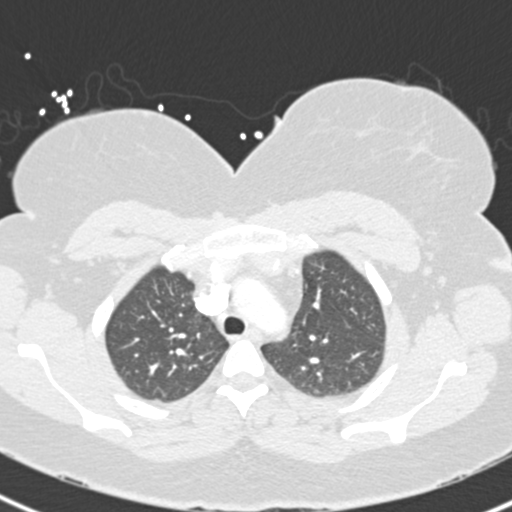
[im 179/221  mediastinal]
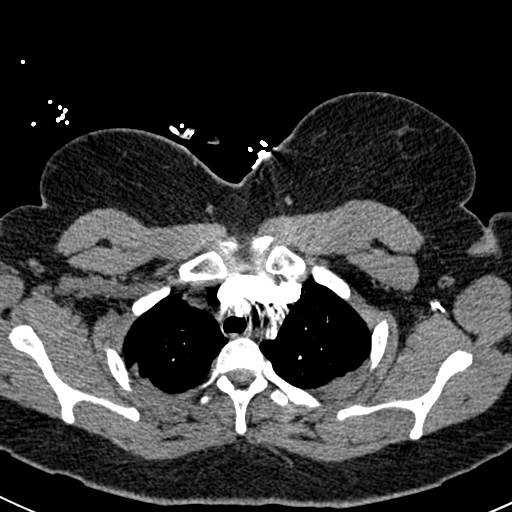
[im 193/221  lung]
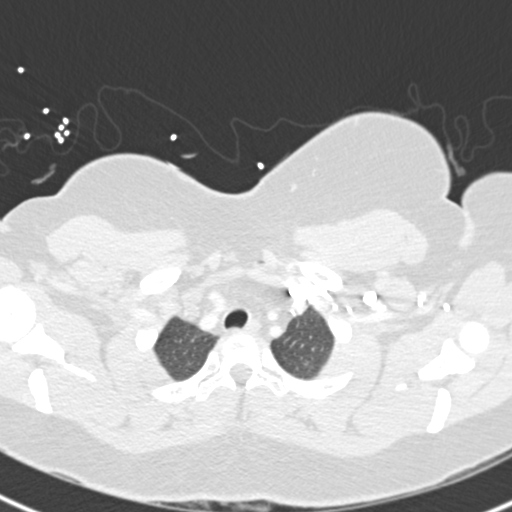
[im 207/221  mediastinal]
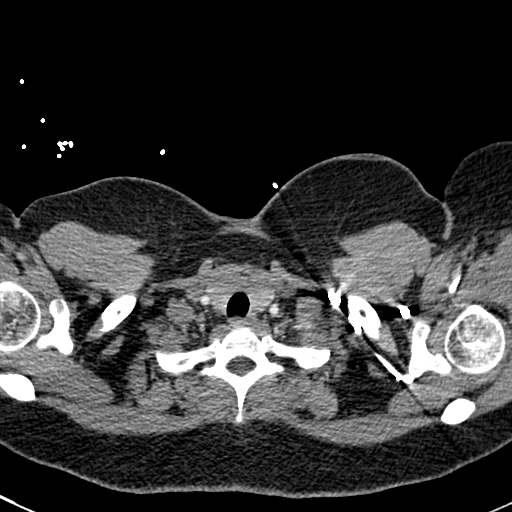

[Series 9: lung windows · axial · 0.62mm/px · z∈[-202,-166]mm · 2 of 70 slices shown]
[im 18/70  mediastinal]
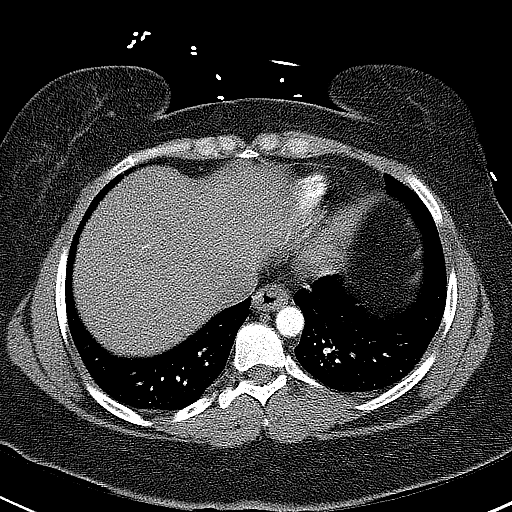
[im 30/70  mediastinal]
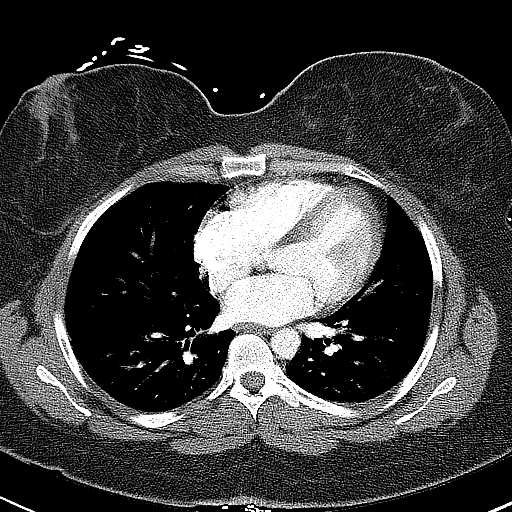

[20 of 33 positions shown; findings below may reference images not displayed]

FINDINGS: No filling defects in the pulmonary arteries to suggest pulmonary
emboli. Heart is normal size. Aorta is normal caliber. No
mediastinal, hilar, or axillary adenopathy. Visualized thyroid and
chest wall soft tissues unremarkable. Imaging into the upper abdomen
shows no acute findings.

Review of the MIP images confirms the above findings.
IMPRESSION: Negative

## 2015-04-08 ENCOUNTER — Ambulatory Visit (INDEPENDENT_AMBULATORY_CARE_PROVIDER_SITE_OTHER): Payer: BLUE CROSS/BLUE SHIELD | Admitting: Emergency Medicine

## 2015-04-08 ENCOUNTER — Ambulatory Visit (INDEPENDENT_AMBULATORY_CARE_PROVIDER_SITE_OTHER): Payer: BLUE CROSS/BLUE SHIELD

## 2015-04-08 VITALS — BP 120/72 | HR 104 | Temp 99.6°F | Resp 18 | Ht 61.5 in | Wt 183.0 lb

## 2015-04-08 DIAGNOSIS — R509 Fever, unspecified: Secondary | ICD-10-CM

## 2015-04-08 DIAGNOSIS — R05 Cough: Secondary | ICD-10-CM | POA: Diagnosis not present

## 2015-04-08 DIAGNOSIS — R059 Cough, unspecified: Secondary | ICD-10-CM

## 2015-04-08 LAB — POCT CBC
Granulocyte percent: 74.4 %G (ref 37–80)
HEMATOCRIT: 89 % — AB (ref 37.7–47.9)
Hemoglobin: 41 g/dL — AB (ref 12.2–16.2)
LYMPH, POC: 1.7 (ref 0.6–3.4)
MCH: 33.4 pg — AB (ref 27–31.2)
MCHC: 13 g/dL — AB (ref 31.8–35.4)
MCV: 29.7 fL — AB (ref 80–97)
MID (CBC): 0.1 (ref 0–0.9)
POC Granulocyte: 5.4 (ref 2–6.9)
POC LYMPH PERCENT: 24 %L (ref 10–50)
POC MID %: 1.6 % (ref 0–12)
Platelet Count, POC: 7.1 10*3/uL — AB (ref 142–424)
RBC: 13.7 M/uL — AB (ref 4.04–5.48)
RDW, POC: 261 %
WBC: 7.2 10*3/uL (ref 4.6–10.2)

## 2015-04-08 LAB — POCT INFLUENZA A/B
Influenza A, POC: NEGATIVE
Influenza B, POC: NEGATIVE

## 2015-04-08 LAB — POCT RAPID STREP A (OFFICE): Rapid Strep A Screen: NEGATIVE

## 2015-04-08 MED ORDER — BENZONATATE 100 MG PO CAPS: 100.0000 mg | ORAL_CAPSULE | Freq: Three times a day (TID) | ORAL | Status: DC | PRN

## 2015-04-08 NOTE — Patient Instructions (Signed)
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
## 2015-09-19 ENCOUNTER — Other Ambulatory Visit: Payer: Self-pay | Admitting: Obstetrics & Gynecology

## 2015-09-25 LAB — CYTOLOGY - PAP

## 2016-05-27 ENCOUNTER — Ambulatory Visit (INDEPENDENT_AMBULATORY_CARE_PROVIDER_SITE_OTHER): Payer: BLUE CROSS/BLUE SHIELD | Admitting: Family Medicine

## 2016-05-27 ENCOUNTER — Encounter: Payer: Self-pay | Admitting: Family Medicine

## 2016-05-27 VITALS — BP 118/80 | HR 87 | Temp 98.9°F | Ht 61.5 in | Wt 184.7 lb

## 2016-05-27 DIAGNOSIS — J069 Acute upper respiratory infection, unspecified: Secondary | ICD-10-CM

## 2016-05-27 DIAGNOSIS — B9789 Other viral agents as the cause of diseases classified elsewhere: Secondary | ICD-10-CM | POA: Diagnosis not present

## 2016-05-27 MED ORDER — BENZONATATE 100 MG PO CAPS: 100.0000 mg | ORAL_CAPSULE | Freq: Two times a day (BID) | ORAL | 0 refills | Status: DC | PRN

## 2016-05-27 NOTE — Progress Notes (Signed)
Pre visit review using our clinic review tool, if applicable. No additional management support is needed unless otherwise documented below in the visit note. 

## 2016-05-27 NOTE — Patient Instructions (Signed)
BEFORE YOU LEAVE: -follow up: CPE in next 3 months  I sent the tessalon to the pharmacy for the cough.  I hope you feel better soon.   INSTRUCTIONS FOR UPPER RESPIRATORY INFECTION:  -plenty of rest and fluids  -nasal saline wash 2-3 times daily (use prepackaged nasal saline or bottled/distilled water if making your own)   -can use AFRIN nasal spray for drainage and nasal congestion - but do NOT use longer then 3-4 days  -can use tylenol (in no history of liver disease) or ibuprofen (if no history of kidney disease, bowel bleeding or significant heart disease) as directed for aches and sorethroat  -in the winter time, using a humidifier at night is helpful (please follow cleaning instructions)  -if you are taking a cough medication - use only as directed, may also try a teaspoon of honey to coat the throat and throat lozenges. -for sore throat, salt water gargles can help  -follow up if you have fevers, facial pain, tooth pain, difficulty breathing or are worsening or symptoms persist longer then expected  Upper Respiratory Infection, Adult An upper respiratory infection (URI) is also known as the common cold. It is often caused by a type of germ (virus). Colds are easily spread (contagious). You can pass it to others by kissing, coughing, sneezing, or drinking out of the same glass. Usually, you get better in 1 to 3  weeks.  However, the cough can last for even longer. HOME CARE   Only take medicine as told by your doctor. Follow instructions provided above.  Drink enough water and fluids to keep your pee (urine) clear or pale yellow.  Get plenty of rest.  Return to work when your temperature is < 100 for 24 hours or as told by your doctor. You may use a face mask and wash your hands to stop your cold from spreading. GET HELP RIGHT AWAY IF:   After the first few days, you feel you are getting worse.  You have questions about your medicine.  You have chills, shortness of  breath, or red spit (mucus).  You have pain in the face for more then 1-2 days, especially when you bend forward.  You have a fever, puffy (swollen) neck, pain when you swallow, or white spots in the back of your throat.  You have a bad headache, ear pain, sinus pain, or chest pain.  You have a high-pitched whistling sound when you breathe in and out (wheezing).  You cough up blood.  You have sore muscles or a stiff neck. MAKE SURE YOU:   Understand these instructions.  Will watch your condition.  Will get help right away if you are not doing well or get worse. Document Released: 09/18/2007 Document Revised: 06/24/2011 Document Reviewed: 07/07/2013 [facility] Patient Information 2015 [place]. This information is not intended to replace advice given to you by your health care provider. Make sure you discuss any questions you have with your health care provider.
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
## 2016-09-05 ENCOUNTER — Ambulatory Visit (INDEPENDENT_AMBULATORY_CARE_PROVIDER_SITE_OTHER): Payer: BLUE CROSS/BLUE SHIELD | Admitting: Family Medicine

## 2016-09-05 ENCOUNTER — Encounter: Payer: Self-pay | Admitting: Family Medicine

## 2016-09-05 VITALS — BP 104/80 | HR 81 | Temp 98.3°F | Ht 61.5 in | Wt 183.1 lb

## 2016-09-05 DIAGNOSIS — Z6834 Body mass index (BMI) 34.0-34.9, adult: Secondary | ICD-10-CM | POA: Diagnosis not present

## 2016-09-05 DIAGNOSIS — Z0000 Encounter for general adult medical examination without abnormal findings: Secondary | ICD-10-CM

## 2016-09-05 LAB — LIPID PANEL
CHOL/HDL RATIO: 3
Cholesterol: 163 mg/dL (ref 0–200)
HDL: 48.3 mg/dL (ref >39.00)
LDL Cholesterol: 103 mg/dL — ABNORMAL HIGH (ref 0–99)
NonHDL: 115.01
Triglycerides: 62 mg/dL (ref 0.0–149.0)
VLDL: 12.4 mg/dL (ref 0.0–40.0)

## 2016-09-05 LAB — HEMOGLOBIN A1C: Hgb A1c MFr Bld: 5.4 % (ref 4.6–6.5)

## 2016-09-05 NOTE — Patient Instructions (Signed)
BEFORE YOU LEAVE: -follow up: CPE in 1 year -labs  Vit D3 [phone] IU every day ([employer] brand ok.)  See gynecologist yearly.  We have ordered labs or studies at this visit. It can take up to 1-2 weeks for results and processing. IF results require follow up or explanation, we will call you with instructions. Clinically stable results will be released to your [facility]. If you have not heard from us or cannot find your results in [facility] in 2 weeks please contact our office at [phone].  If you are not yet signed up for [facility], please consider signing up.   We recommend the following healthy lifestyle for LIFE: 1) Small portions.   Tip: eat off of a salad plate instead of a dinner plate.  Tip: It is ok to feel hungry after a meal of proper portion size.  Tip: if you need more or a snack choose fruits, veggies and/or a handful of nuts or seeds.  2) Eat a healthy clean diet.  * Tip: Avoid (less then 1 serving per week): processed foods, sweets, sweetened drinks, white starches (rice, flour, bread, potatoes, pasta, etc), red meat, fast foods, butter  *Tip: CHOOSE instead   * 5-9 servings per day of fresh or frozen fruits and vegetables (but not corn, potatoes, bananas, canned or dried fruit)   *nuts and seeds, beans   *olives and olive oil   *small portions of lean meats such as fish and white chicken    *small portions of whole grains  3)Get at least 150 minutes of sweaty aerobic exercise per week.  4)Reduce stress - consider counseling, meditation and relaxation to balance other aspects of your life.
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
## 2017-11-04 ENCOUNTER — Other Ambulatory Visit: Payer: Self-pay

## 2017-11-04 ENCOUNTER — Encounter: Payer: Self-pay | Admitting: Family Medicine

## 2017-11-04 ENCOUNTER — Ambulatory Visit (INDEPENDENT_AMBULATORY_CARE_PROVIDER_SITE_OTHER): Payer: Managed Care, Other (non HMO) | Admitting: Family Medicine

## 2017-11-04 ENCOUNTER — Ambulatory Visit (INDEPENDENT_AMBULATORY_CARE_PROVIDER_SITE_OTHER): Payer: Managed Care, Other (non HMO)

## 2017-11-04 VITALS — BP 102/72 | HR 77 | Temp 98.6°F | Ht 61.25 in | Wt 182.1 lb

## 2017-11-04 DIAGNOSIS — R0789 Other chest pain: Secondary | ICD-10-CM

## 2017-11-04 DIAGNOSIS — E669 Obesity, unspecified: Secondary | ICD-10-CM | POA: Diagnosis not present

## 2017-11-04 DIAGNOSIS — Z0000 Encounter for general adult medical examination without abnormal findings: Secondary | ICD-10-CM | POA: Diagnosis not present

## 2017-11-04 DIAGNOSIS — R0981 Nasal congestion: Secondary | ICD-10-CM | POA: Diagnosis not present

## 2017-11-04 DIAGNOSIS — Z1331 Encounter for screening for depression: Secondary | ICD-10-CM

## 2017-11-04 DIAGNOSIS — R059 Cough, unspecified: Secondary | ICD-10-CM

## 2017-11-04 DIAGNOSIS — R05 Cough: Secondary | ICD-10-CM

## 2017-11-04 DIAGNOSIS — J189 Pneumonia, unspecified organism: Secondary | ICD-10-CM

## 2017-11-04 DIAGNOSIS — Z8349 Family history of other endocrine, nutritional and metabolic diseases: Secondary | ICD-10-CM | POA: Diagnosis not present

## 2017-11-04 DIAGNOSIS — N926 Irregular menstruation, unspecified: Secondary | ICD-10-CM

## 2017-11-04 DIAGNOSIS — J181 Lobar pneumonia, unspecified organism: Principal | ICD-10-CM

## 2017-11-04 LAB — LIPID PANEL
Cholesterol: 186 mg/dL (ref 0–200)
HDL: 54.5 mg/dL (ref >39.00)
LDL Cholesterol: 120 mg/dL — ABNORMAL HIGH (ref 0–99)
NonHDL: 131.64
Total CHOL/HDL Ratio: 3
Triglycerides: 57 mg/dL (ref 0.0–149.0)
VLDL: 11.4 mg/dL (ref 0.0–40.0)

## 2017-11-04 LAB — HEMOGLOBIN A1C: Hgb A1c MFr Bld: 5.3 % (ref 4.6–6.5)

## 2017-11-04 LAB — TSH: TSH: 0.84 u[IU]/mL (ref 0.35–4.50)

## 2017-11-04 MED ORDER — DOXYCYCLINE HYCLATE 100 MG PO TABS: 100.0000 mg | ORAL_TABLET | Freq: Two times a day (BID) | ORAL | 0 refills | Status: DC

## 2017-11-04 NOTE — Patient Instructions (Addendum)
BEFORE YOU LEAVE: -labs -xray -follow up: 1 year and as needed  Start zyrtec nightly.  Follow up if cough/congestion or any other symptoms persist.  We have ordered labs and an xray at this visit. It can take up to 1-2 weeks for results and processing. IF results require follow up or explanation, we will call you with instructions. Clinically stable results will be released to your [facility]. If you have not heard from [place] or cannot find your results in [facility] in 2 weeks please contact our office at [phone].  If you are not yet signed up for [facility], please consider signing up.   Preventive Care 40-64 Years, Female Preventive care refers to lifestyle choices and visits with your health care provider that can promote health and wellness. What does preventive care include?  A yearly physical exam. This is also called an annual well check.  Dental exams once or twice a year.  Routine eye exams. Ask your health care provider how often you should have your eyes checked.  Personal lifestyle choices, including: ? Daily care of your teeth and gums. ? Regular physical activity. ? Eating a healthy diet. ? Avoiding tobacco and drug use. ? Limiting alcohol use. ? Practicing safe sex. ? Taking vitamin and mineral supplements as recommended by your health care provider. What happens during an annual well check? The services and screenings done by your health care provider during your annual well check will depend on your age, overall health, lifestyle risk factors, and family history of disease. Counseling Your health care provider may ask you questions about your:  Alcohol use.  Tobacco use.  Drug use.  Emotional well-being.  Home and relationship well-being.  Sexual activity.  Eating habits.  Work and work Statistician.  Method of birth control.  Menstrual cycle.  Pregnancy history.  Screening You may have the following tests or measurements:  Height, weight, and  BMI.  Blood pressure.  Lipid and cholesterol levels. These may be checked every 5 years, or more frequently if you are over 36 years old.  Skin check.  Lung cancer screening. You may have this screening every year starting at age 78 if you have a 30-pack-year history of smoking and currently smoke or have quit within the past 15 years.  Fecal occult blood test (FOBT) of the stool. You may have this test every year starting at age 58.  Flexible sigmoidoscopy or colonoscopy. You may have a sigmoidoscopy every 5 years or a colonoscopy every 10 years starting at age 85.  Hepatitis C blood test.  Hepatitis B blood test.  Sexually transmitted disease (STD) testing.  Diabetes screening. This is done by checking your blood sugar (glucose) after you have not eaten for a while (fasting). You may have this done every 1-3 years.  Mammogram. This may be done every 1-2 years. Talk to your health care provider about when you should start having regular mammograms. This may depend on whether you have a family history of breast cancer.  BRCA-related cancer screening. This may be done if you have a family history of breast, ovarian, tubal, or peritoneal cancers.  Pelvic exam and Pap test. This may be done every 3 years starting at age 75. Starting at age 60, this may be done every 5 years if you have a Pap test in combination with an HPV test.  Bone density scan. This is done to screen for osteoporosis. You may have this scan if you are at high risk for osteoporosis.  Discuss your test results, treatment options, and if necessary, the need for more tests with your health care provider. Vaccines Your health care provider may recommend certain vaccines, such as:  Influenza vaccine. This is recommended every year.  Tetanus, diphtheria, and acellular pertussis (Tdap, Td) vaccine. You may need a Td booster every 10 years.  Varicella vaccine. You may need this if you have not been vaccinated.  Zoster  vaccine. You may need this after age 88.  Measles, mumps, and rubella (MMR) vaccine. You may need at least one dose of MMR if you were born in 1957 or later. You may also need a second dose.  Pneumococcal 13-valent conjugate (PCV13) vaccine. You may need this if you have certain conditions and were not previously vaccinated.  Pneumococcal polysaccharide (PPSV23) vaccine. You may need one or two doses if you smoke cigarettes or if you have certain conditions.  Meningococcal vaccine. You may need this if you have certain conditions.  Hepatitis A vaccine. You may need this if you have certain conditions or if you travel or work in places where you may be exposed to hepatitis A.  Hepatitis B vaccine. You may need this if you have certain conditions or if you travel or work in places where you may be exposed to hepatitis B.  Haemophilus influenzae type b (Hib) vaccine. You may need this if you have certain conditions.  Talk to your health care provider about which screenings and vaccines you need and how often you need them. This information is not intended to replace advice given to you by your health care provider. Make sure you discuss any questions you have with your health care provider. Document Released: 04/28/2015 Document Revised: 12/20/2015 Document Reviewed: 01/31/2015 Elsevier Interactive Patient Education  [employer].

## 2017-11-07 ENCOUNTER — Ambulatory Visit: Payer: Managed Care, Other (non HMO) | Admitting: Family Medicine

## 2017-11-07 ENCOUNTER — Encounter: Payer: Self-pay | Admitting: Family Medicine

## 2017-11-07 ENCOUNTER — Telehealth: Payer: Self-pay | Admitting: Family Medicine

## 2017-11-07 VITALS — BP 120/64 | HR 91 | Temp 98.4°F | Wt 183.9 lb

## 2017-11-07 DIAGNOSIS — J181 Lobar pneumonia, unspecified organism: Secondary | ICD-10-CM | POA: Diagnosis not present

## 2017-11-07 DIAGNOSIS — R062 Wheezing: Secondary | ICD-10-CM

## 2017-11-07 DIAGNOSIS — J189 Pneumonia, unspecified organism: Secondary | ICD-10-CM

## 2017-11-07 MED ORDER — E-Z SPACER DEVI: 2 refills | Status: DC

## 2017-11-07 MED ORDER — ALBUTEROL SULFATE (2.5 MG/3ML) 0.083% IN NEBU
2.5000 mg | INHALATION_SOLUTION | Freq: Once | RESPIRATORY_TRACT | Status: AC
  Administered 2017-11-07: 2.5 mg via RESPIRATORY_TRACT

## 2017-11-07 MED ORDER — ALBUTEROL SULFATE HFA 108 (90 BASE) MCG/ACT IN AERS
2.0000 | INHALATION_SPRAY | Freq: Four times a day (QID) | RESPIRATORY_TRACT | 0 refills | Status: DC | PRN
Start: 2017-11-07 — End: 2018-10-08

## 2017-11-07 NOTE — Telephone Encounter (Signed)
Dr Caryl NeverBurchette was informed of the message below and stated the pt should be seen today due to worsening symptoms.  Dr Hassan RowanKoberlein agreed to see the pt and an appt was scheduled for today at 1:30pm.

## 2017-11-07 NOTE — Telephone Encounter (Signed)
Copied from CRM [id number]. Topic: General - Other >> Nov 07, 2017  9:26 AM Rebecca Simon, Cheryl W wrote:  Pt would like a call back concerning her symptons of pneumonia   [phone]

## 2017-11-07 NOTE — Progress Notes (Signed)
SOAP   Rebecca Simon DOB: 1975-06-21 Encounter date: 11/07/2017  This is a 42 y.o. female who presents with Chief Complaint  Patient presents with  . Cough    pneumonia in the right lung, Dx tuesday, patient feels that her cough and SOB has gotten worse since then,  patient states the medication does not seem to be helping    History of present illness:  Tuesday had some tightness and continuous cough. Dx with pneumonia. Started on doxycycline. Called today feeling like symptoms were not getting better. Still with some pain in chest with movement and cough. Hasn't gone back to work, but when she went to grab laptop the cold air made her start coughing more. Shortness of breath is a little worse. No signif hx of breathing issues, no hx of inhaler use. Single dx of walking pneumonia as teen. Doxy is tolerated with mild upset stomach.     Allergies  Allergen Reactions  . Sulfa Antibiotics Anaphylaxis and Swelling  . Vicodin [Hydrocodone-Acetaminophen] Other (See Comments)    Nightmares, visual disturbances   Current Meds  Medication Sig  . doxycycline (VIBRA-TABS) 100 MG tablet Take 1 tablet (100 mg total) by mouth 2 (two) times daily.  . Norethindrone-Ethinyl Estradiol-Fe Biphas (LO LOESTRIN FE) 1 MG-10 MCG / 10 MCG tablet Take 1 tablet by mouth every evening.    Review of Systems  Constitutional: Positive for activity change (less walking due to fatigue) and fatigue. Negative for chills and fever.  HENT: Positive for congestion and sinus pain (right side, but off and on).   Respiratory: Positive for cough (feels that cold air triggers more; coughing fits worse at night. ) and wheezing (notes in certain positions).   Cardiovascular: Positive for chest pain (hurts in mid-center; notes more with twisting or cough).    Objective:  BP 120/64 (BP Location: Left Arm, Patient Position: Sitting, Cuff Size: Normal)   Pulse 91   Temp 98.4 F (36.9 C) (Oral)   Wt 183 lb 14.4 oz (83.4  kg)   LMP 11/02/2017 (Exact Date)   SpO2 99%   BMI 34.46 kg/m   Weight: 183 lb 14.4 oz (83.4 kg)   BP Readings from Last 3 Encounters:  11/07/17 120/64  11/04/17 102/72  09/05/16 104/80   Wt Readings from Last 3 Encounters:  11/07/17 183 lb 14.4 oz (83.4 kg)  11/04/17 182 lb 1.6 oz (82.6 kg)  09/05/16 183 lb 1.6 oz (83.1 kg)    Physical Exam  Constitutional: She appears well-developed and well-nourished. No distress.  Cardiovascular: Normal rate, regular rhythm and normal heart sounds. Exam reveals no friction rub.  No murmur heard. Pulmonary/Chest: Effort normal. No respiratory distress. She has wheezes (Initial exam: anterior end exp wheezing more prominent on left. post albuterol neb in office, end expiratory anterior wheeze resolved. Slight overall improvement in air exchange. +symptomatic improvement in chest tightness.). She has no rales.  Psychiatric: She has a normal mood and affect.    Assessment/Plan  1. Pneumonia of right middle lobe due to infectious organism (HCC) Complete doxycycline. If worsening of sx would consider change in abx. Please report to [place] if not feeling better by Monday. Discussed expectation for duration of symptoms and time to recovery. - albuterol (PROVENTIL) (2.5 MG/3ML) 0.083% nebulizer solution 2.5 mg  2. Wheezing Mild in office and resolved with albuterol neb. Inhaler use TID for next few days, then decrease to prn use. Will check in on Monday to ensure improvement of symptoms but have instructed  to call if any worsening in meanwhile. Could consider prednisone burst if wheezing persists.  - albuterol (PROVENTIL) (2.5 MG/3ML) 0.083% nebulizer solution 2.5 mg     Return if symptoms worsen or fail to improve.   Theodis ShoveJunell Michiah Masse, MD

## 2017-11-07 NOTE — Patient Instructions (Addendum)
Let us know if any worsening of symptoms over the weekend.   OK to use the albuterol inhaler three times daily for the next few days. After that, just use as needed.   Complete the doxycycline as directed.

## 2017-11-07 NOTE — Telephone Encounter (Signed)
I called the pt and she questioned how long the chest tightness will last.  She stated she has a recurrent cough, feels the shortness of breath is worsening, denies a fever and is not taking any medication for the cough.  I advised the pt I will forward this message to another provider for review as Dr Selena BattenKim is not in the office.  Message forwarded to Dr Caryl NeverBurchette.

## 2017-11-10 ENCOUNTER — Telehealth: Payer: Self-pay

## 2017-11-10 ENCOUNTER — Other Ambulatory Visit: Payer: Managed Care, Other (non HMO)

## 2017-11-10 ENCOUNTER — Other Ambulatory Visit: Payer: Self-pay | Admitting: Family Medicine

## 2017-11-10 DIAGNOSIS — R0602 Shortness of breath: Secondary | ICD-10-CM

## 2017-11-10 NOTE — Progress Notes (Signed)
Called and left message for Pt to call back. CRM created.

## 2017-11-10 NOTE — Telephone Encounter (Signed)
Glad that evening cough is a little better. I spoke with Dr. Selena BattenKim as well about her symptoms. We think it is worthwhile to have her get a d-dimer checked just to make sure that there isn't any other underlying cause for the chest tightness and shortness of breath like a blood clot. This result is usually quite quick to come back so we can check back in with her once we obtain this result and see how she is doing. It is fine for her to continue 2 puffs of the albuterol inhaler TID just to help keep air flowing a little easier in the meanwhile. It will take some time to recover from this, so just continue to update with any concerns.

## 2017-11-10 NOTE — Telephone Encounter (Signed)
Called and discussed Dr. Cyril MourningKoberleins advice with the patient. The patient has made a lab appointment for today at 2:00pm to have her d-dimer checked. Patient was advised to continue the medications per the directions of Dr. Hassan RowanKoberlein and is aware that we will call her once we receive results from her lab work. Nothing further needed.

## 2017-11-10 NOTE — Telephone Encounter (Signed)
Called the patient per request of Dr. Hassan RowanKoberlein, no answer, left a message for patient to call back.

## 2017-11-10 NOTE — Telephone Encounter (Signed)
-----   Message from Wynn BankerJunell C Koberlein, MD sent at 11/08/2017  9:27 AM EDT ----- Can you please check in with Rebecca Simon on Monday and see how her cough and breathing are feeling? Let me know if she is not doing better. thx!

## 2017-11-10 NOTE — Telephone Encounter (Signed)
Pt calling Rebecca Simon back she stating that her cough is better except at night she still have SOB when she moves around and that her chest is still a little tight

## 2017-11-10 NOTE — Telephone Encounter (Signed)
Will send to Dr. Hassan RowanKoberlein to see if she has any advise/ recommendations

## 2017-11-11 ENCOUNTER — Telehealth: Payer: Self-pay | Admitting: Family Medicine

## 2017-11-11 LAB — D-DIMER, QUANTITATIVE (NOT AT ARMC): D DIMER QUANT: 0.43 ug{FEU}/mL (ref <0.50)

## 2017-11-11 NOTE — Telephone Encounter (Signed)
Copied from CRM [id number]. Topic: Quick Communication - Lab Results >> Nov 11, 2017 10:02 AM Rebecca Simon, [name] wrote: Pt called in for her results.   CB: [id number]

## 2017-11-11 NOTE — Telephone Encounter (Signed)
It was normal! Thank good ness. Hopefully she will start feeling better sooner. If worsening or not feeling somewhat better over the next few days recommend appt to re-eval. Thanks.

## 2017-11-11 NOTE — Telephone Encounter (Signed)
I called the pt and informed her of the message below

## 2017-11-12 ENCOUNTER — Other Ambulatory Visit: Payer: Self-pay | Admitting: Family Medicine

## 2017-11-12 ENCOUNTER — Telehealth: Payer: Self-pay | Admitting: Family Medicine

## 2017-11-12 MED ORDER — PREDNISONE 20 MG PO TABS: 40.0000 mg | ORAL_TABLET | Freq: Every day | ORAL | 0 refills | Status: DC

## 2017-11-12 NOTE — Telephone Encounter (Signed)
Copied from CRM [id number]. Topic: General - Other >> Nov 12, 2017 12:18 PM [name], Nickalos Petersen L, NT wrote: Reason for CRM: Patient called to follow up from a conversation she had today with someone from the practice ,she was told  a steroid would be  call in for her please advise [phone], she does need to know if it ok to go to work or not  she and the medication sent to  [facility] [phone] - [place] - [address] AT [facility] [address] [phone] (Phone) [phone] (Fax)

## 2017-11-12 NOTE — Telephone Encounter (Signed)
It's under lab comments; I sent in prednisone for her and sent message back to Bartlett[place]say.

## 2017-11-12 NOTE — Telephone Encounter (Signed)
Noted, thanks!

## 2017-11-12 NOTE — Telephone Encounter (Signed)
Patient was seen 11/07/17 for PNA. I do not see any documentation that anyone from our office has spoken w/ her today, and no documentation regarding steroid medication. Do either of you know what she is referencing?

## 2017-11-20 ENCOUNTER — Encounter: Payer: Self-pay | Admitting: Family Medicine

## 2017-11-20 ENCOUNTER — Ambulatory Visit: Payer: Managed Care, Other (non HMO) | Admitting: Family Medicine

## 2017-11-20 ENCOUNTER — Ambulatory Visit: Payer: Self-pay | Admitting: *Deleted

## 2017-11-20 VITALS — BP 100/78 | HR 69 | Temp 98.8°F | Ht 61.25 in | Wt 184.2 lb

## 2017-11-20 DIAGNOSIS — R059 Cough, unspecified: Secondary | ICD-10-CM

## 2017-11-20 DIAGNOSIS — R0982 Postnasal drip: Secondary | ICD-10-CM

## 2017-11-20 DIAGNOSIS — R05 Cough: Secondary | ICD-10-CM | POA: Diagnosis not present

## 2017-11-20 DIAGNOSIS — J189 Pneumonia, unspecified organism: Secondary | ICD-10-CM

## 2017-11-20 DIAGNOSIS — J181 Lobar pneumonia, unspecified organism: Secondary | ICD-10-CM | POA: Diagnosis not present

## 2017-11-20 MED ORDER — AMOXICILLIN-POT CLAVULANATE 875-125 MG PO TABS: 1.0000 | ORAL_TABLET | Freq: Two times a day (BID) | ORAL | 0 refills | Status: DC

## 2017-11-20 MED ORDER — BENZONATATE 100 MG PO CAPS: 100.0000 mg | ORAL_CAPSULE | Freq: Three times a day (TID) | ORAL | 0 refills | Status: DC | PRN

## 2017-11-20 MED ORDER — FLUTICASONE PROPIONATE 50 MCG/ACT NA SUSP: 2.0000 | Freq: Every day | NASAL | 1 refills | Status: DC

## 2017-11-20 NOTE — Telephone Encounter (Signed)
  Pt states SOB with exertion. Dx with pneumonia 11/04/17. States has completed ATBs and course of prednisone.  States SOB is with exertion only, moderate, some wheezing as well with exertion. Afebrile. Reports mild dry cough, mild chest tightness at times with cough.  States has been using inhaler as RXed. Appt made for today with Dr. Selena BattenKim. Care advise given per protocol.  Reason for Disposition . [1] MODERATE longstanding difficulty breathing (e.g., speaks in phrases, SOB even at rest, pulse 100-120) AND [2] SAME as normal    Pneumonia 11/04/17; SOB with exertion only.  Answer Assessment - Initial Assessment Questions 1. RESPIRATORY STATUS: "Describe your breathing?" (e.g., wheezing, shortness of breath, unable to speak, severe coughing)      SOB with exertion only 2. ONSET: "When did this breathing problem begin?"      After dx with pneumonia July. 3. PATTERN "Does the difficult breathing come and go, or has it been constant since it started?"      With exertion only 4. SEVERITY: "How bad is your breathing?" (e.g., mild, moderate, severe)    - MILD: No SOB at rest, mild SOB with walking, speaks normally in sentences, can lay down, no retractions, pulse < 100.    - MODERATE: SOB at rest, SOB with minimal exertion and prefers to sit, cannot lie down flat, speaks in phrases, mild retractions, audible wheezing, pulse 100-120.    - SEVERE: Very SOB at rest, speaks in single words, struggling to breathe, sitting hunched forward, retractions, pulse > 120      moderate 5. RECURRENT SYMPTOM: "Have you had difficulty breathing before?" If so, ask: "When was the last time?" and "What happened that time?"      Pneumonia 6. CARDIAC HISTORY: "Do you have any history of heart disease?" (e.g., heart attack, angina, bypass surgery, angioplasty)      no 7. LUNG HISTORY: "Do you have any history of lung disease?"  (e.g., pulmonary embolus, asthma, emphysema)     no 8. CAUSE: "What do you think is causing the  breathing problem?"      unsure 9. OTHER SYMPTOMS: "Do you have any other symptoms? (e.g., dizziness, runny nose, cough, chest pain, fever)     Cough, small amount secretions at times, clear  Protocols used: BREATHING DIFFICULTY-A-AH
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
## 2017-11-20 NOTE — Telephone Encounter (Signed)
Patient is on the schedule this morning to see Dr. Selena BattenKim.

## 2017-11-20 NOTE — Patient Instructions (Signed)
BEFORE YOU LEAVE: -follow up: 10-14 days for recheck  Take the Augmentin as prescribed.  Use tessalon and/or albuterol as needed.  Nasal saline twice daily to clean nose  Flonase 2 sprays each nostril daily for 4 weeks  Healthy low sugar diet with some fermented foods daily  Gentle exercise ok  I hope you are feeling better soon! Seek care promptly if your symptoms worsen, new concerns arise or you are not improving with treatment.

## 2017-11-20 NOTE — Telephone Encounter (Signed)
Patient has arrived here now for office visit.
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
## 2017-12-02 ENCOUNTER — Encounter: Payer: Self-pay | Admitting: Family Medicine

## 2017-12-02 ENCOUNTER — Ambulatory Visit: Payer: Managed Care, Other (non HMO) | Admitting: Family Medicine

## 2017-12-02 VITALS — BP 92/60 | HR 81 | Temp 98.5°F | Ht 61.25 in | Wt 183.5 lb

## 2017-12-02 DIAGNOSIS — J189 Pneumonia, unspecified organism: Secondary | ICD-10-CM | POA: Diagnosis not present

## 2017-12-02 DIAGNOSIS — K219 Gastro-esophageal reflux disease without esophagitis: Secondary | ICD-10-CM | POA: Diagnosis not present

## 2017-12-02 DIAGNOSIS — R0982 Postnasal drip: Secondary | ICD-10-CM

## 2017-12-02 DIAGNOSIS — R05 Cough: Secondary | ICD-10-CM | POA: Diagnosis not present

## 2017-12-02 DIAGNOSIS — R059 Cough, unspecified: Secondary | ICD-10-CM

## 2017-12-02 NOTE — Patient Instructions (Addendum)
BEFORE YOU LEAVE: -schedule xray visit in 2-3 weeks -follow up: 1 month  Get the chest xray in about 2-3 weeks.  Continue the flonase  Start Nexium over the counter dose and take once daily for 4 weeks  See dietary changes below for acid reflux  Ease back into regular exercise  Call to let us know how you are doing in 2 weeks if not improving, sooner if worsening   Food Choices for Gastroesophageal Reflux Disease, Adult When you have gastroesophageal reflux disease (GERD), the foods you eat and your eating habits are very important. Choosing the right foods can help ease your discomfort. What guidelines do I need to follow?  Choose fruits, vegetables, whole grains, and low-fat dairy products.  Choose low-fat meat, fish, and poultry.  Limit fats such as oils, salad dressings, butter, nuts, and avocado.  Keep a food diary. This helps you identify foods that cause symptoms.  Avoid foods that cause symptoms. These may be different for everyone.  Eat small meals often instead of 3 large meals a day.  Eat your meals slowly, in a place where you are relaxed.  Limit fried foods.  Cook foods using methods other than frying.  Avoid drinking alcohol.  Avoid drinking large amounts of liquids with your meals.  Avoid bending over or lying down until 2-3 hours after eating. What foods are not recommended? These are some foods and drinks that may make your symptoms worse: Vegetables Tomatoes. Tomato juice. Tomato and spaghetti sauce. Chili peppers. Onion and garlic. Horseradish. Fruits Oranges, grapefruit, and lemon (fruit and juice). Meats High-fat meats, fish, and poultry. This includes hot dogs, ribs, ham, sausage, salami, and bacon. Dairy Whole milk and chocolate milk. Sour cream. Cream. Butter. Ice cream. Cream cheese. Drinks Coffee and tea. Bubbly (carbonated) drinks or energy drinks. Condiments Hot sauce. Barbecue sauce. Sweets/Desserts Chocolate and cocoa. Donuts.  Peppermint and spearmint. Fats and Oils High-fat foods. This includes [place] fries and potato chips. Other Vinegar. Strong spices. This includes black pepper, white pepper, red pepper, cayenne, curry powder, cloves, ginger, and chili powder. The items listed above may not be a complete list of foods and drinks to avoid. Contact your dietitian for more information. This information is not intended to replace advice given to you by your health care provider. Make sure you discuss any questions you have with your health care provider. Document Released: 10/01/2011 Document Revised: 09/07/2015 Document Reviewed: 02/03/2013 Elsevier Interactive Patient Education  2017 [employer]
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
## 2018-01-05 ENCOUNTER — Encounter: Payer: Self-pay | Admitting: Family Medicine

## 2018-01-05 ENCOUNTER — Ambulatory Visit: Payer: Managed Care, Other (non HMO) | Admitting: Family Medicine

## 2018-01-05 ENCOUNTER — Ambulatory Visit (INDEPENDENT_AMBULATORY_CARE_PROVIDER_SITE_OTHER): Payer: Managed Care, Other (non HMO)

## 2018-01-05 VITALS — BP 102/80 | HR 82 | Temp 98.7°F | Ht 61.25 in | Wt 185.0 lb

## 2018-01-05 DIAGNOSIS — R05 Cough: Secondary | ICD-10-CM | POA: Diagnosis not present

## 2018-01-05 DIAGNOSIS — J189 Pneumonia, unspecified organism: Secondary | ICD-10-CM

## 2018-01-05 DIAGNOSIS — R059 Cough, unspecified: Secondary | ICD-10-CM

## 2018-01-05 DIAGNOSIS — R0600 Dyspnea, unspecified: Secondary | ICD-10-CM | POA: Diagnosis not present

## 2018-01-05 NOTE — Patient Instructions (Signed)
BEFORE YOU LEAVE: -xray -follow up: 3-4 months and as needed  Get the xray before leaving.  -We placed a referral for you as discussed to the lung specialist. It usually takes about 1-2 weeks to process and schedule this referral. If you have not heard from us regarding this appointment in 2 weeks please contact our office.  Continue current medications for now.

## 2018-01-20 ENCOUNTER — Ambulatory Visit: Payer: Managed Care, Other (non HMO) | Admitting: Internal Medicine

## 2018-01-20 ENCOUNTER — Encounter: Payer: Self-pay | Admitting: Internal Medicine

## 2018-01-20 ENCOUNTER — Other Ambulatory Visit (INDEPENDENT_AMBULATORY_CARE_PROVIDER_SITE_OTHER): Payer: Managed Care, Other (non HMO)

## 2018-01-20 VITALS — BP 122/78 | HR 96 | Ht 60.75 in | Wt 186.0 lb

## 2018-01-20 DIAGNOSIS — J45991 Cough variant asthma: Secondary | ICD-10-CM

## 2018-01-20 DIAGNOSIS — R0609 Other forms of dyspnea: Secondary | ICD-10-CM | POA: Insufficient documentation

## 2018-01-20 LAB — CBC WITH DIFFERENTIAL/PLATELET
Basophils Absolute: 0.1 10*3/uL (ref 0.0–0.1)
Basophils Relative: 0.9 % (ref 0.0–3.0)
EOS ABS: 0.2 10*3/uL (ref 0.0–0.7)
EOS PCT: 1.7 % (ref 0.0–5.0)
HCT: 37.9 % (ref 36.0–46.0)
HEMOGLOBIN: 12.6 g/dL (ref 12.0–15.0)
Lymphocytes Relative: 27.4 % (ref 12.0–46.0)
Lymphs Abs: 2.8 10*3/uL (ref 0.7–4.0)
MCHC: 33.2 g/dL (ref 30.0–36.0)
MCV: 90.4 fl (ref 78.0–100.0)
MONO ABS: 0.6 10*3/uL (ref 0.1–1.0)
Monocytes Relative: 6.1 % (ref 3.0–12.0)
Neutro Abs: 6.4 10*3/uL (ref 1.4–7.7)
Neutrophils Relative %: 63.9 % (ref 43.0–77.0)
Platelets: 295 10*3/uL (ref 150.0–400.0)
RBC: 4.2 Mil/uL (ref 3.87–5.11)
RDW: 12.8 % (ref 11.5–15.5)
WBC: 10.1 10*3/uL (ref 4.0–10.5)

## 2018-01-20 LAB — BRAIN NATRIURETIC PEPTIDE: PRO B NATRI PEPTIDE: 59 pg/mL (ref 0.0–100.0)

## 2018-01-20 MED ORDER — PANTOPRAZOLE SODIUM 40 MG PO TBEC: DELAYED_RELEASE_TABLET | ORAL | 2 refills | Status: DC

## 2018-01-20 MED ORDER — BENZONATATE 200 MG PO CAPS: 200.0000 mg | ORAL_CAPSULE | Freq: Three times a day (TID) | ORAL | 2 refills | Status: DC | PRN

## 2018-01-20 NOTE — Patient Instructions (Addendum)
For cough > tessilon 200 mg every 8 hours if needed   Stop nexium and start protonix 40 mg Take 30- 60 min before your first and last meals of the day   GERD (REFLUX)  is an extremely common cause of respiratory symptoms just like yours , many times with no obvious heartburn at all.    It can be treated with medication, but also with lifestyle changes including elevation of the head of your bed (ideally with 6 inch  bed blocks),  Smoking cessation, avoidance of late meals, excessive alcohol, and avoid fatty foods, chocolate, peppermint, colas, red wine, and acidic juices such as orange juice.  NO MINT OR MENTHOL PRODUCTS SO NO COUGH DROPS   USE SUGARLESS CANDY INSTEAD (Jolley ranchers or Stover's or Life Savers) or even ice chips will also do - the key is to swallow to prevent all throat clearing. NO OIL BASED VITAMINS - use powdered substitutes.    Please remember to go to the lab department downstairs in the basement  for your tests - we will call you with the results when they are available.      Please schedule a follow up office visit in 4 weeks, sooner if needed
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
## 2018-01-21 ENCOUNTER — Encounter: Payer: Self-pay | Admitting: Internal Medicine

## 2018-01-21 LAB — RESPIRATORY ALLERGY PROFILE REGION II ~~LOC~~
ALLERGEN, CEDAR TREE, T6: 0.61 kU/L — AB
ALLERGEN, COTTONWOOD, T14: 2.75 kU/L — AB
ALLERGEN, D PTERNOYSSINUS, D1: 0.77 kU/L — AB
ALLERGEN, OAK, T7: 95.3 kU/L — AB
Allergen, A. alternata, m6: <0.1 kU/L
Allergen, Comm Silver Birch, t9: 48 kU/L — ABNORMAL HIGH
Allergen, Mouse Urine Protein, e78: <0.1 kU/L
Allergen, Mulberry, t76: 0.19 kU/L — ABNORMAL HIGH
Allergen, P. notatum, m1: <0.1 kU/L
BERMUDA GRASS: 0.31 kU/L — AB
Box Elder IgE: 8.49 kU/L — ABNORMAL HIGH
CLADOSPORIUM HERBARUM (M2) IGE: <0.1 kU/L
CLASS: 0
CLASS: 0
CLASS: 0
CLASS: 0
CLASS: 1
CLASS: 2
CLASS: 2
CLASS: 2
CLASS: 3
CLASS: 4
COCKROACH: 4.29 kU/L — AB
COMMON RAGWEED (SHORT) (W1) IGE: 3.82 kU/L — ABNORMAL HIGH
Class: 0
Class: 0
Class: 0
Class: 0
Class: 0
Class: 0
Class: 2
Class: 2
Class: 3
Class: 3
Class: 3
Class: 3
Class: 4
Class: 5
D. farinae: 1.1 kU/L — ABNORMAL HIGH
DOG DANDER: 0.22 kU/L — AB
Elm IgE: 17.4 kU/L — ABNORMAL HIGH
IgE (Immunoglobulin E), Serum: 379 kU/L — ABNORMAL HIGH (ref <=114)
Johnson Grass: 1.62 kU/L — ABNORMAL HIGH
Pecan/Hickory Tree IgE: 18.3 kU/L — ABNORMAL HIGH
ROUGH PIGWEED IGE: 1.65 kU/L — AB
Sheep Sorrel IgE: 0.32 kU/L — ABNORMAL HIGH
TIMOTHY GRASS: 5.16 kU/L — AB

## 2018-01-21 LAB — INTERPRETATION:

## 2018-01-21 LAB — D-DIMER, QUANTITATIVE: D-Dimer, Quant: 0.8 mcg/mL FEU — ABNORMAL HIGH (ref <0.50)

## 2018-01-21 NOTE — Assessment & Plan Note (Signed)
Spirometry 01/20/2018  FEV1 2.2 (99%)  Ratio 85  s curvature or prior rx  - 01/20/2018  Walked RA x 3 laps @ 185 ft each stopped due to  End of study, fast pace, no sob or desat    Symptoms are markedly disproportionate to objective findings and not clear to what extent this is actually a pulmonary  problem but pt does appear to have difficult to sort out respiratory symptoms of unknown origin for which  DDX  = almost all start with A and  include Adherence, Ace Inhibitors, Acid Reflux, Active Sinus Disease, Alpha 1 Antitripsin deficiency, Anxiety masquerading as Airways dz,  ABPA,  Allergy(esp in young), Aspiration (esp in elderly), Adverse effects of meds,  Active smokers, A bunch of PE's/clot burden (a few small clots can't cause this syndrome unless there is already severe underlying pulm or vascular dz with poor reserve),  Anemia or thyroid disorder, plus two Bs  = Bronchiectasis and Beta blocker use..and one C= CHF     Adherence is always the initial "prime suspect" and is a multilayered concern that requires a "trust but verify" approach in every patient - starting with knowing how to use medications, especially inhalers, correctly, keeping up with refills and understanding the fundamental difference between maintenance and prns vs those medications only taken for a very short course and then stopped and not refilled.  - return  with all meds in hand using a trust but verify approach to confirm accurate Medication  Reconciliation The principal here is that until we are certain that the  patients are doing what we've asked, it makes no sense to ask them to do more.   ? Acid (or non-acid) GERD > always difficult to exclude as up to 75% of pts in some series report no assoc GI/ Heartburn symptoms and she has HB on BCP's > rec max (24h)  acid suppression and diet restrictions/ reviewed and instructions given in writing/ consider lower or no Estrogen  - Of the three most common causes of  Sub-acute /  recurrent or chronic cough, only one (GERD)  can actually contribute to/ trigger  the other two (asthma and post nasal drip syndrome)  and perpetuate the cylce of cough.  While not intuitively obvious, many patients with chronic low grade reflux do not cough until there is a primary insult that disturbs the protective epithelial barrier and exposes sensitive nerve endings.   This is typically viral but can due to PNDS and  either may apply here.   The point is that once this occurs, it is difficult to eliminate the cycle  using anything but a maximally effective acid suppression regimen at least in the short run, accompanied by an appropriate diet to address non acid GERD and control / eliminate the cough itself for at least 3 days with 200 if possible and tramadol short term/ gabapentin longterm if necessary   ? Allergy /asthma h tessalon > doubt > send allergy profile, ok to use saba prn   Anemia/ thyroid dz excluded today  ? A Bunch of PE's >  D dimer is slt elevated vs priors and she is on bcps but a high normal value (seen commonly in the elderly or chronically ill)  may miss small peripheral pe, the clot burden with sob is moderately high and so even her  d dimer  has a very high neg pred value if used in this setting.  On the other hand, she is on bcps age  42 with unexplained cp/ sob so I believe we're obligated to do the CTa and she will also need venous dopplers to complete the w/u   ? chf > excluded with such a low bnp today.

## 2018-01-21 NOTE — Assessment & Plan Note (Signed)
Not convincee this is asthma but ok to use saba/ rx for cyclical cough then regroup in 4 weeks     Total time devoted to counseling  > 50 % of initial 60 min office visit:  review case with pt/ discussion of options/alternatives/ personally creating written customized instructions  in presence of pt  then going over those specific  Instructions directly with the pt including how to use all of the meds but in particular covering each new medication in detail and the difference between the maintenance= "automatic" meds and the prns using an action plan format for the latter (If this problem/symptom => do that organization reading Left to right).  Please see AVS from this visit for a full list of these instructions which I personally wrote for this pt and  are unique to this visit.

## 2018-01-22 ENCOUNTER — Telehealth: Payer: Self-pay | Admitting: Internal Medicine

## 2018-01-22 DIAGNOSIS — R7989 Other specified abnormal findings of blood chemistry: Secondary | ICD-10-CM

## 2018-01-22 NOTE — Addendum Note (Signed)
Addended by: Jaynee Eagles C on: 01/22/2018 12:03 PM   Modules accepted: Orders

## 2018-01-22 NOTE — Telephone Encounter (Signed)
Notes recorded by Nyoka Cowden, MD on 01/22/2018 at 4:52 AM EDT Call patient : Studies are C/w allergies to everything but cats, mod severe> Be sure patient has/keeps f/u ov so we can go over all the details of this study and get a plan together moving forward - ok to move up f/u if not feeling better and wants to be seen sooner   Also needs CTa and venous dopplers for elevated d dimer but my clinic suspicion is low / need to consider d/c bcp at age 42 ----------------------------------------------- Spoke with pt. She is aware of her results. Orders have been placed for CTa and venous dopplers. Nothing further was needed.

## 2018-01-22 NOTE — Progress Notes (Signed)
LMTCB

## 2018-01-23 ENCOUNTER — Ambulatory Visit (HOSPITAL_COMMUNITY)
Admission: RE | Admit: 2018-01-23 | Discharge: 2018-01-23 | Disposition: A | Discharge status: Home / Self Care | Payer: Managed Care, Other (non HMO) | Source: Ambulatory Visit | Attending: Internal Medicine | Admitting: Internal Medicine

## 2018-01-23 DIAGNOSIS — R7989 Other specified abnormal findings of blood chemistry: Secondary | ICD-10-CM

## 2018-01-23 NOTE — Progress Notes (Signed)
Bilateral lower extremity venous duplex has been completed. Negative for DVT. Results were given to [facility] at Dr. Thurston Hole office.  01/23/18 2:29 PM Olen Cordial RVT

## 2018-01-26 NOTE — Progress Notes (Signed)
Spoke with pt and notified of results per Dr. Wert. Pt verbalized understanding and denied any questions. 

## 2018-01-28 ENCOUNTER — Telehealth: Payer: Self-pay | Admitting: Internal Medicine

## 2018-01-28 NOTE — Telephone Encounter (Signed)
Scheduled at [facility] for 10/19 # 9:00.  Pt to arrive 15 mins early & no prep.  Faxed order & med recs to [phone].  Notified pt of appt info & pt stated nothing further needed at this time.

## 2018-01-28 NOTE — Telephone Encounter (Signed)
Patient would like to reschedule CT angio with chest and have this done at [facility].  She states this is for cost reasons as it is over $500 cheaper per her insurance.  She is wanting to know if we can send order there for this to be done.  Phone #[phone], fax #[phone].

## 2018-01-28 NOTE — Telephone Encounter (Signed)
Will route message to [facility] to have this rescheduled.

## 2018-01-29 ENCOUNTER — Ambulatory Visit (HOSPITAL_COMMUNITY): Payer: Managed Care, Other (non HMO)

## 2018-01-30 ENCOUNTER — Telehealth: Payer: Self-pay | Admitting: Internal Medicine

## 2018-01-30 NOTE — Telephone Encounter (Signed)
CT Angio has been precerted.  Faxed order & precert from [facility] website to [phone].  Nothing further needed.

## 2018-01-30 NOTE — Telephone Encounter (Signed)
Spoke with [facility]  She is requesting authorization of CTA  It is scheduled for 02/01/18  Please help with this PCC's thanks

## 2018-02-04 ENCOUNTER — Telehealth: Payer: Self-pay | Admitting: Internal Medicine

## 2018-02-04 NOTE — Telephone Encounter (Signed)
Received CTA results from [facility]- done on 01/31/18  Per MW- study is WNL  I called and spoke with the pt and notified of results and she verbalized understanding

## 2018-02-17 ENCOUNTER — Encounter: Payer: Self-pay | Admitting: Internal Medicine

## 2018-02-17 ENCOUNTER — Ambulatory Visit: Payer: Managed Care, Other (non HMO) | Admitting: Internal Medicine

## 2018-02-17 VITALS — BP 102/60 | HR 85 | Ht 60.75 in | Wt 186.0 lb

## 2018-02-17 DIAGNOSIS — J45991 Cough variant asthma: Secondary | ICD-10-CM

## 2018-02-17 DIAGNOSIS — R0609 Other forms of dyspnea: Secondary | ICD-10-CM

## 2018-02-17 LAB — NITRIC OXIDE: NITRIC OXIDE: 13

## 2018-02-17 MED ORDER — PANTOPRAZOLE SODIUM 40 MG PO TBEC: DELAYED_RELEASE_TABLET | ORAL | 2 refills | Status: DC

## 2018-02-17 MED ORDER — MONTELUKAST SODIUM 10 MG PO TABS: 10.0000 mg | ORAL_TABLET | Freq: Every day | ORAL | 11 refills | Status: DC

## 2018-02-17 NOTE — Progress Notes (Signed)
Rebecca Simon, female    DOB: [birth year],  MRN: [id number]     Brief patient profile:  42 yobf quit smoking 2014 s any trouble then around 2017 onset cough esp at work where moved into new office then much worse mid July 2019 with yellowish mucus > cxr ? RML pna  11/04/17 > rx two abx and a steroid with clearing on cxr 01/05/18  but still sense of midline chest discomfort s radiation worse  with cough mostly  after supper but quiets down over night regardless of weekday or weeknight and better p saba so referred to pulmonary clinic 01/20/2018 by Dr   Kriste Basque     History of Present Illness  01/20/2018  1st pulmonary eval/ Shirleyann Montero  Chief Complaint  Patient presents with  . Pulmonary Consult    Referred by Dr. Kriste Basque. Pt c/o SOB since July 2019.  Cough is occ prod with white sputum.  She states she feels SOB all the time, esp with exertion such as walking room to room. She uses her albuterol inhaler once daily on average.   Dyspnea:  X room to room  Cough: pattern as above p supper > min mucoid production now  Sleep: flat bed x 3-4 pillows  SABA use: once a day p supper seems to help some  Tessalon 100 helps some    - says has hb if not taking nexium 20 mg with bfast rec For cough > tessilon 200 mg every 8 hours if needed  Stop nexium and start protonix 40 mg Take 30- 60 min before your first and last meals of the day  GERD  Diet  Please schedule a follow up office visit in 4 weeks, sooner if needed     02/17/2018  f/u ov/Raymondo Garcialopez re:  Uacs   Chief Complaint  Patient presents with  . Follow-up    Cough is much better. Her breathing is about the same.  She is using her albuterol inhaler about 2 x per wk.   Dyspnea:  Not limited by breathing from desired activities (previously reported room to room)   Cough: after supper / not using ppi before supper and ran out of flonase / has not tried singulair in past  Sleeping: flat bed/ 3 pillows SABA use: one a week  02:none   No obvious day  to day or daytime variability or assoc excess/ purulent sputum or mucus plugs or hemoptysis or cp or chest tightness, subjective wheeze or overt sinus or hb symptoms.   Sleeping  without nocturnal  or early am exacerbation  of respiratory  c/o's or need for noct saba. Also denies any obvious fluctuation of symptoms with weather or environmental changes or other aggravating or alleviating factors except as outlined above   No unusual exposure hx or h/o childhood pna/ asthma or knowledge of premature birth.  Current Allergies, Complete Past Medical History, Past Surgical History, Family History, and Social History were reviewed in [employer] record.  ROS  The following are not active complaints unless bolded Hoarseness, sore throat, dysphagia, dental problems, itching, sneezing,  nasal congestion or discharge of excess mucus or purulent secretions, ear ache,   fever, chills, sweats, unintended wt loss or wt gain, classically pleuritic or exertional cp,  orthopnea pnd or arm/hand swelling  or leg swelling, presyncope, palpitations, abdominal pain, anorexia, nausea, vomiting, diarrhea  or change in bowel habits or change in bladder habits, change in stools or change in urine, dysuria, hematuria,  rash, arthralgias, visual complaints, headache, numbness, weakness or ataxia or problems with walking or coordination,  change in mood or  memory.        Current Meds  Medication Sig  . albuterol (PROVENTIL HFA;VENTOLIN HFA) 108 (90 Base) MCG/ACT inhaler Inhale 2 puffs into the lungs every 6 (six) hours as needed for wheezing or shortness of breath.  . benzonatate (TESSALON) 200 MG capsule Take 1 capsule (200 mg total) by mouth 3 (three) times daily as needed for cough.  . fluticasone (FLONASE) 50 MCG/ACT nasal spray Place 2 sprays into both nostrils daily.  . Norethindrone-Ethinyl Estradiol-Fe Biphas (LO LOESTRIN FE) 1 MG-10 MCG / 10 MCG tablet Take 1 tablet by mouth every evening.             Objective:     amb bf nad   Wt Readings from Last 3 Encounters:  02/17/18 186 lb (84.4 kg)  01/20/18 186 lb (84.4 kg)  01/05/18 185 lb (83.9 kg)     Vital signs reviewed - Note on arrival 02 sats  99% on RA        HEENT: nl dentition, turbinates bilaterally, and oropharynx. Nl external ear canals without cough reflex   NECK :  without JVD/Nodes/TM/ nl carotid upstrokes bilaterally   LUNGS: no acc muscle use,  Nl contour chest which is clear to A and P bilaterally without cough on insp or exp maneuvers   CV:  RRR  no s3 or murmur or increase in P2, and no edema   ABD:  soft and nontender with nl inspiratory excursion in the supine position. No bruits or organomegaly appreciated, bowel sounds nl  MS:  Nl gait/ ext warm without deformities, calf tenderness, cyanosis or clubbing No obvious joint restrictions   SKIN: warm and dry without lesions    NEURO:  alert, approp, nl sensorium with  no motor or cerebellar deficits apparent.       I personally reviewed images and agree with radiology impression as follows:   Chest CTa  01/31/18  > wnl          Assessment

## 2018-02-17 NOTE — Patient Instructions (Addendum)
Protonix 40 mg Take 30- 60 min before your first and last meals of the day  Start singulair 10 mg each pm and resume flonase to see if this helps your cough tendency   Only use the tessalon if you need it - if you are improving you should notice you don't need it as much any more    Please schedule a follow up visit in 3 months but call sooner if needed  with all medications /inhalers/ solutions in hand so we can verify exactly what you are taking. This includes all medications from all doctors and over the counters

## 2018-02-19 ENCOUNTER — Encounter: Payer: Self-pay | Admitting: Internal Medicine

## 2018-02-19 NOTE — Assessment & Plan Note (Addendum)
Spirometry 01/20/2018  FEV1 2.2 (99%)  Ratio 85  s curvature or prior rx  - 01/20/2018  Walked RA x 3 laps @ 185 ft each stopped due to  End of study, fast pace, no sob or desat    - Venous dopplers neg bilaterally 01/23/18  - CTa chest 01/31/18  wnl  - Spirometry 02/17/2018  FEV1 2.6 (118%)  Ratio 91 s prior rx     No further w/u needed  At this point

## 2018-02-19 NOTE — Assessment & Plan Note (Addendum)
Allergy profile 01/20/18  >  Eos 0.2 /  IgE  379  RAST pos for everything but cats  - Spirometry 02/17/2018  FEV1 2.6 (118%)  Ratio 91 s prior rx   - FENO 02/17/2018  =   13 off rx - trial of singulair  02/17/2018 >>>   - The proper method of use, as well as anticipated side effects, of a metered-dose inhaler are discussed and demonstrated to the patient  Presently needing saba < 2x weekly    Not clear there is any asthma here at all and could be all Upper airway cough syndrome (previously labeled PNDS),  is so named because it's frequently impossible to sort out how much is  CR/sinusitis with freq throat clearing (which can be related to primary GERD)   vs  causing  secondary (" extra esophageal")  GERD from wide swings in gastric pressure that occur with throat clearing, often  promoting self use of mint and menthol lozenges that reduce the lower esophageal sphincter tone and exacerbate the problem further in a cyclical fashion.   These are the same pts (now being labeled as having "irritable larynx syndrome" by some cough centers) who not infrequently have a history of having failed to tolerate ace inhibitors,  dry powder inhalers or biphosphonates or report having atypical/extraesophageal reflux symptoms that don't respond to standard doses of PPI  and are easily confused as having aecopd or asthma flares by even experienced allergists/ pulmonologists (myself included).     Since main issues may  be uppper airway based   rec  - max rx for gerd x 3 months - singulair trial - if not doing great next step > allergy eval next   I had an extended summary final discussion with the patient reviewing all relevant studies completed to date and  lasting 15 to 20 minutes of a 25 minute visit    See device teaching which extended face to face time for this visit.  Each maintenance medication was reviewed in detail including emphasizing most importantly the difference between maintenance and prns and under  what circumstances the prns are to be triggered using an action plan format that is not reflected in the computer generated alphabetically organized AVS which I have not found useful in most complex patients, especially with respiratory illnesses  Please see AVS for specific instructions unique to this visit that I personally wrote and verbalized to the the pt in detail and then reviewed with pt  by my nurse highlighting any  changes in therapy recommended at today's visit to their plan of care.
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
## 2018-04-06 ENCOUNTER — Ambulatory Visit: Payer: Managed Care, Other (non HMO) | Admitting: Family Medicine

## 2018-04-06 ENCOUNTER — Encounter: Payer: Self-pay | Admitting: Family Medicine

## 2018-04-06 VITALS — BP 100/64 | HR 81 | Temp 98.8°F | Ht 60.75 in | Wt 187.0 lb

## 2018-04-06 DIAGNOSIS — E785 Hyperlipidemia, unspecified: Secondary | ICD-10-CM | POA: Diagnosis not present

## 2018-04-06 DIAGNOSIS — J069 Acute upper respiratory infection, unspecified: Secondary | ICD-10-CM

## 2018-04-06 LAB — POC INFLUENZA A&B (BINAX/QUICKVUE)
INFLUENZA B, POC: NEGATIVE
Influenza A, POC: NEGATIVE

## 2018-04-06 NOTE — Patient Instructions (Addendum)
BEFORE YOU LEAVE: -flu test -follow up: 4-6 months  I hope you are feeling better soon! Seek care promptly if your symptoms worsen, new concerns arise or you are not improving with treatment.   We recommend the following healthy lifestyle for LIFE: 1) Small portions. But, make sure to get regular (at least 3 per day), healthy meals and small healthy snacks if needed.  2) Eat a healthy clean diet.   TRY TO EAT: -at least 5-7 servings of low sugar, colorful, and nutrient rich vegetables per day (not corn, potatoes or bananas.) -berries are the best choice if you wish to eat fruit (only eat small amounts if trying to reduce weight)  -lean meets (fish, white meat of chicken or [place]) -vegan proteins for some meals - beans or tofu, whole grains, nuts and seeds -Replace bad fats with good fats - good fats include: fish, nuts and seeds, canola oil, olive oil -small amounts of low fat or non fat dairy -small amounts of100 % whole grains - check the lables -drink plenty of water  AVOID: -SUGAR, sweets, anything with added sugar, corn syrup or sweeteners - must read labels as even foods advertised as "healthy" often are loaded with sugar -if you must have a sweetener, small amounts of stevia may be best -sweetened beverages and artificially sweetened beverages -simple starches (rice, bread, potatoes, pasta, chips, etc - small amounts of 100% whole grains are ok) -red meat, pork, butter -fried foods, fast food, processed food, excessive dairy, eggs and coconut.  3)Get at least 150 minutes of sweaty aerobic exercise per week.  4)Reduce stress - consider counseling, meditation and relaxation to balance other aspects of your life.

## 2018-04-06 NOTE — Addendum Note (Signed)
Addended by: Johnella MoloneyFUNDERBURK, JO A on: 04/06/2018 09:32 AM   Modules accepted: Orders

## 2018-05-12 ENCOUNTER — Ambulatory Visit (INDEPENDENT_AMBULATORY_CARE_PROVIDER_SITE_OTHER): Payer: Managed Care, Other (non HMO) | Admitting: Family Medicine

## 2018-05-12 ENCOUNTER — Ambulatory Visit (INDEPENDENT_AMBULATORY_CARE_PROVIDER_SITE_OTHER): Payer: Managed Care, Other (non HMO)

## 2018-05-12 ENCOUNTER — Encounter: Payer: Self-pay | Admitting: Family Medicine

## 2018-05-12 VITALS — BP 102/70 | HR 95 | Temp 98.6°F | Ht 60.75 in | Wt 182.8 lb

## 2018-05-12 DIAGNOSIS — J45991 Cough variant asthma: Secondary | ICD-10-CM | POA: Diagnosis not present

## 2018-05-12 DIAGNOSIS — J989 Respiratory disorder, unspecified: Secondary | ICD-10-CM

## 2018-05-12 DIAGNOSIS — J111 Influenza due to unidentified influenza virus with other respiratory manifestations: Secondary | ICD-10-CM

## 2018-05-12 DIAGNOSIS — R69 Illness, unspecified: Secondary | ICD-10-CM | POA: Diagnosis not present

## 2018-05-12 MED ORDER — PREDNISONE 20 MG PO TABS: 40.0000 mg | ORAL_TABLET | Freq: Every day | ORAL | 0 refills | Status: DC

## 2018-05-12 MED ORDER — BENZONATATE 100 MG PO CAPS: 100.0000 mg | ORAL_CAPSULE | Freq: Three times a day (TID) | ORAL | 0 refills | Status: DC | PRN
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
## 2018-05-12 NOTE — Patient Instructions (Signed)
BEFORE YOU LEAVE: -xray -follow up: with your pulmonologist if any worsening or not improving with treatment  I sent the prednisone and the tessalon to the pharmacy. Use per instructions.  Albuterol as needed.  I hope you are feeling better soon! Seek care promptly if your symptoms worsen, new concerns arise or you are not improving with treatment.

## 2018-05-21 ENCOUNTER — Ambulatory Visit: Payer: Managed Care, Other (non HMO) | Admitting: Internal Medicine

## 2018-05-21 ENCOUNTER — Encounter: Payer: Self-pay | Admitting: Internal Medicine

## 2018-05-21 VITALS — BP 120/74 | HR 72 | Ht 60.75 in | Wt 183.4 lb

## 2018-05-21 DIAGNOSIS — J45991 Cough variant asthma: Secondary | ICD-10-CM

## 2018-05-21 MED ORDER — PANTOPRAZOLE SODIUM 40 MG PO TBEC: DELAYED_RELEASE_TABLET | ORAL | 2 refills | Status: DC

## 2018-05-21 NOTE — Patient Instructions (Addendum)
Stop singulair (montelukast)  Pantoprazole (protonix) 40 mg   Take 30- 60 min before your first and last meals of the day   GERD (REFLUX)  is an extremely common cause of respiratory symptoms just like yours , many times with no obvious heartburn at all.    It can be treated with medication, but also with lifestyle changes including elevation of the head of your bed (ideally with 6 -8inch blocks under the headboard of your bed),  Smoking cessation, avoidance of late meals, excessive alcohol, and avoid fatty foods, chocolate, peppermint, colas, red wine, and acidic juices such as orange juice.  NO MINT OR MENTHOL PRODUCTS SO NO COUGH DROPS  USE SUGARLESS CANDY INSTEAD (Jolley ranchers or Stover's or Life Savers) or even ice chips will also do - the key is to swallow to prevent all throat clearing. NO OIL BASED VITAMINS - use powdered substitutes.  Avoid fish oil when coughing.    Please see patient coordinator before you leave today  to schedule allergy eval Dr Kathyrn LassKozlow's group    Follow up here is as needed

## 2018-05-21 NOTE — Progress Notes (Signed)
Rebecca Simon, female    DOB: 11-Feb-1976,  MRN: [id number]     Brief patient profile:  89  yobf quit smoking 2014 with h/o asthma dx by pediatrician and seasonal rhinitis never on shots/never saw allergist and does not remember problem with sob in HS/ need for inhalers with sports    then around 2017 onset cough esp at work where moved into new office then much worse mid July 2019 with yellowish mucus > cxr ? RML pna  11/04/17 > rx two abx and a steroid with clearing on cxr 01/05/18  but still sense of midline chest discomfort s radiation worse  with cough mostly  after supper but quiets down over night regardless of weekday or weeknight and better p saba so referred to pulmonary clinic 01/20/2018 by Dr   Kriste BasqueHannah Kim     History of Present Illness  01/20/2018  1st pulmonary eval/ Rebecca Simon  Chief Complaint  Patient presents with  . Pulmonary Consult    Referred by Dr. Kriste BasqueHannah Kim. Pt c/o SOB since July 2019.  Cough is occ prod with white sputum.  She states she feels SOB all the time, esp with exertion such as walking room to room. She uses her albuterol inhaler once daily on average.   Dyspnea:  X room to room  Cough: pattern as above p supper > min mucoid production now  Sleep: flat bed x 3-4 pillows  SABA use: once a day p supper seems to help some  Tessalon 100 helps some    - says has hb if not taking nexium 20 mg with bfast rec For cough > tessilon 200 mg every 8 hours if needed  Stop nexium and start protonix 40 mg Take 30- 60 min before your first and last meals of the day  GERD  Diet  Please schedule a follow up office visit in 4 weeks, sooner if needed     02/17/2018  f/u ov/Rebecca Simon re:  Uacs  With h/o seasonal rhinitis  Chief Complaint  Patient presents with  . Follow-up    Cough is much better. Her breathing is about the same.  She is using her albuterol inhaler about 2 x per wk.   Dyspnea:  Not limited by breathing from desired activities (previously reported room to room)     Cough: after supper / not using ppi before supper and ran out of flonase / has not tried singulair in past  Sleeping: flat bed/ 3 pillows SABA use: once a week  rec Protonix 40 mg Take 30- 60 min before your first and last meals of the day Start singulair 10 mg each pm and resume flonase to see if this helps your cough tendency  Only use the tessalon if you need it - if you are improving you should notice you don't need it as much any more   05/12/18 seen by Kriste BasqueHannah Kim with uri but no wheeze/ rx prednisone   05/21/2018  f/u ov/Rebecca Simon re: uacs on ppi bid and singulair can't tell it's helping / prednisone helps the most  Chief Complaint  Patient presents with  . Cough    with some congestion, but clear/white in color   Dyspnea:  Only with cough Cough: worse after supper and better sleeping assoc nasal congestion /pnds but no wheeze  Sleeping: sleeps ok propped up  SABA use: doesn't help cough 02: none    No obvious day to day or daytime variability or assoc excess/ purulent sputum or mucus  plugs or hemoptysis or cp or chest tightness, subjective wheeze or overt sinus or hb symptoms.   Seeping as above  without nocturnal  or early am exacerbation  of respiratory  c/o's or need for noct saba. Also denies any obvious fluctuation of symptoms with weather or environmental changes or other aggravating or alleviating factors except as outlined above   No unusual exposure hx or h/o childhood pna/ asthma or knowledge of premature birth.  Current Allergies, Complete Past Medical History, Past Surgical History, Family History, and Social History were reviewed in [employer] record.  ROS  The following are not active complaints unless bolded Hoarseness, sore throat, dysphagia, dental problems, itching, sneezing,  nasal congestion or discharge of excess mucus or purulent secretions, ear ache,   fever, chills, sweats, unintended wt loss or wt gain, classically pleuritic or  exertional cp,  orthopnea pnd or arm/hand swelling  or leg swelling, presyncope, palpitations, abdominal pain, anorexia, nausea, vomiting, diarrhea  or change in bowel habits or change in bladder habits, change in stools or change in urine, dysuria, hematuria,  rash, arthralgias, visual complaints, headache, numbness, weakness or ataxia or problems with walking or coordination,  change in mood or  memory.        Current Meds  Medication Sig  . albuterol (PROVENTIL HFA;VENTOLIN HFA) 108 (90 Base) MCG/ACT inhaler Inhale 2 puffs into the lungs every 6 (six) hours as needed for wheezing or shortness of breath.  . benzonatate (TESSALON PERLES) 100 MG capsule Take 1 capsule (100 mg total) by mouth 3 (three) times daily as needed.  . montelukast (SINGULAIR) 10 MG tablet Take 1 tablet (10 mg total) by mouth at bedtime.  . Norethindrone-Ethinyl Estradiol-Fe Biphas (LO LOESTRIN FE) 1 MG-10 MCG / 10 MCG tablet Take 1 tablet by mouth every evening.  . pantoprazole (PROTONIX) 40 MG tablet Take 30- 60 min before your first and last meals of the day                   Objective:     amb bf nad   05/21/2018          183   02/17/18 186 lb (84.4 kg)  01/20/18 186 lb (84.4 kg)  01/05/18 185 lb (83.9 kg)     Vital signs reviewed - Note on arrival 02 sats  98% on RA     HEENT: nl dentition,  and oropharynx. Nl external ear canals without cough reflex - mod  bilateral non-specific turbinate edema     NECK :  without JVD/Nodes/TM/ nl carotid upstrokes bilaterally   LUNGS: no acc muscle use,  Nl contour chest which is clear to A and P bilaterally without cough on insp or exp maneuvers   CV:  RRR  no s3 or murmur or increase in P2, and no edema   ABD:  soft and nontender with nl inspiratory excursion in the supine position. No bruits or organomegaly appreciated, bowel sounds nl  MS:  Nl gait/ ext warm without deformities, calf tenderness, cyanosis or clubbing No obvious joint restrictions    SKIN: warm and dry without lesions    NEURO:  alert, approp, nl sensorium with  no motor or cerebellar deficits apparent.         I personally reviewed images and agree with radiology impression as follows:  CXR:   05/12/18 No active cardiopulmonary disease.         Assessment

## 2018-05-22 ENCOUNTER — Encounter: Payer: Self-pay | Admitting: Internal Medicine

## 2018-05-22 NOTE — Assessment & Plan Note (Signed)
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  Please see AVS for specific  Instructions which are unique to this visit and I personally typed out  which were reviewed in detail in writing with the patient and a copy provided.

## 2018-06-19 ENCOUNTER — Encounter: Payer: Self-pay | Admitting: Allergy

## 2018-06-19 ENCOUNTER — Ambulatory Visit: Payer: Managed Care, Other (non HMO) | Admitting: Allergy

## 2018-06-19 VITALS — BP 118/80 | HR 74 | Temp 98.4°F | Resp 16 | Ht 61.2 in | Wt 184.2 lb

## 2018-06-19 DIAGNOSIS — H1013 Acute atopic conjunctivitis, bilateral: Secondary | ICD-10-CM | POA: Diagnosis not present

## 2018-06-19 DIAGNOSIS — J3089 Other allergic rhinitis: Secondary | ICD-10-CM | POA: Diagnosis not present

## 2018-06-19 DIAGNOSIS — R062 Wheezing: Secondary | ICD-10-CM | POA: Diagnosis not present

## 2018-06-19 DIAGNOSIS — T781XXD Other adverse food reactions, not elsewhere classified, subsequent encounter: Secondary | ICD-10-CM

## 2018-06-19 MED ORDER — OLOPATADINE HCL 0.2 % OP SOLN: 1.0000 [drp] | OPHTHALMIC | 5 refills | Status: DC

## 2018-06-19 MED ORDER — AZELASTINE-FLUTICASONE 137-50 MCG/ACT NA SUSP: 1.0000 | Freq: Two times a day (BID) | NASAL | 5 refills | Status: DC
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
## 2018-06-19 NOTE — Patient Instructions (Addendum)
Allergies  - environmental allergy skin testing today is positive to trees, weeds, grasses, molds, dust mites, cockroach  - allergen avoidance measures discussed/handouts provided  - trial dymista 1 spray each nostril twice a day.  This is a combination nasal spray with Flonase + Astelin (nasal antihistamine).  This helps with both nasal congestion and drainage.   - try long-acting antihistamine Allegra 180mg  daily  - for itchy/watery/red eyes use Pazeo or Pataday 1 drop each eye daily as needed  - allergen immunotherapy discussed today including protocol, benefits and risk.  Informational handout provided.  If interested in this therapuetic option you can check with your insurance carrier for coverage.  Let us know if you would like to proceed with this option.    Wheezing - have access to albuterol inhaler 2 puffs every 4-6 hours as needed for cough/wheeze/shortness of breath/chest tightness.  May use 15-20 minutes prior to activity.   Monitor frequency of use.    Pollen food allergy syndrome  - skin testing for select foods is positive to carrots and negative for apples and peaches  - The oral allergy syndrome (OAS) or pollen-food allergy syndrome (PFAS) is a relatively common form of food allergy, particularly in adults. It typically occurs in people who have pollen allergies when the immune system "sees" proteins on the food that look like proteins on the pollen. This results in the allergy antibody (IgE) binding to the food instead of the pollen. Patients typically report itching and/or mild swelling of the mouth and throat immediately following ingestion of certain uncooked fruits (including nuts) or raw vegetables. Only a very small number of affected individuals experience systemic allergic reactions, such as anaphylaxis which occurs with true food allergies.        Follow-up 6 months or sooner if needed

## 2018-06-23 ENCOUNTER — Telehealth: Payer: Self-pay

## 2018-06-23 NOTE — Addendum Note (Signed)
Addended by: Mliss Fritz I on: 06/23/2018 07:18 AM   Modules accepted: Orders

## 2018-06-23 NOTE — Telephone Encounter (Signed)
Prior auth initiated but insurance wasn't able to identify the pt.  06/23/2018

## 2018-10-06 ENCOUNTER — Ambulatory Visit: Payer: Managed Care, Other (non HMO) | Admitting: Family Medicine

## 2018-10-08 ENCOUNTER — Encounter: Payer: Self-pay | Admitting: Family Medicine

## 2018-10-08 ENCOUNTER — Ambulatory Visit (INDEPENDENT_AMBULATORY_CARE_PROVIDER_SITE_OTHER): Payer: Managed Care, Other (non HMO) | Admitting: Family Medicine

## 2018-10-08 ENCOUNTER — Telehealth: Payer: Self-pay | Admitting: *Deleted

## 2018-10-08 ENCOUNTER — Other Ambulatory Visit: Payer: Self-pay

## 2018-10-08 VITALS — Wt 188.0 lb

## 2018-10-08 DIAGNOSIS — E6609 Other obesity due to excess calories: Secondary | ICD-10-CM | POA: Diagnosis not present

## 2018-10-08 DIAGNOSIS — Z6830 Body mass index (BMI) 30.0-30.9, adult: Secondary | ICD-10-CM

## 2018-10-08 DIAGNOSIS — J45991 Cough variant asthma: Secondary | ICD-10-CM

## 2018-10-08 DIAGNOSIS — E785 Hyperlipidemia, unspecified: Secondary | ICD-10-CM | POA: Diagnosis not present

## 2018-10-08 DIAGNOSIS — J302 Other seasonal allergic rhinitis: Secondary | ICD-10-CM | POA: Diagnosis not present

## 2018-10-08 MED ORDER — PROAIR RESPICLICK 108 (90 BASE) MCG/ACT IN AEPB: 2.0000 | INHALATION_SPRAY | RESPIRATORY_TRACT | 3 refills | Status: DC | PRN
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
## 2018-10-08 NOTE — Telephone Encounter (Signed)
-----   Message from Lucretia Kern, DO sent at 10/08/2018 10:19 AM EDT ----- -abstract pap from Dr. Alwyn Pea for patient-preventive visit with Dr. Ethlyn Gallery in a few months - can keep for scheduled date

## 2018-10-08 NOTE — Telephone Encounter (Signed)
I called the pt and left a detailed message at her cell number to call the office and let me know if she has had a pap since the date which is in her chart from 09/2015.  I also left a message to keep the appt as scheduled with Dr Ethlyn Gallery on 8/28.

## 2018-10-14 ENCOUNTER — Other Ambulatory Visit: Payer: Self-pay | Admitting: Pediatric Intensive Care

## 2018-10-14 ENCOUNTER — Ambulatory Visit: Payer: Self-pay

## 2018-10-14 DIAGNOSIS — Z20822 Contact with and (suspected) exposure to covid-19: Secondary | ICD-10-CM

## 2018-10-14 NOTE — Telephone Encounter (Signed)
Incoming call from Pt.  Pt requesting a Covid-19 Test due to exposure .  Triaged Pt.  Referred to local community testing site.  Pt.  States that she will go to community testing site.   Answer Assessment - Initial Assessment Questions 1. COVID-19 DIAGNOSIS: "Who made your Coronavirus (COVID-19) diagnosis?" "Was it confirmed by a positive lab test?" If not diagnosed by a HCP, ask "Are there lots of cases (community spread) where you live?" (See public health department website, if unsure)   Job 2. ONSET: "When did the COVID-19 symptoms start?"      No Sx 3. WORST SYMPTOM: "What is your worst symptom?" (e.g., cough, fever, shortness of breath, muscle aches)     No Sx 4. COUGH: "Do you have a cough?" If so, ask: "How bad is the cough?"       No Sx 5. FEVER: "Do you have a fever?" If so, ask: "What is your temperature, how was it measured, and when did it start?"    denies 6. RESPIRATORY STATUS: "Describe your breathing?" (e.g., shortness of breath, wheezing, unable to speak)      Denies SOB 7. BETTER-SAME-WORSE: "Are you getting better, staying the same or getting worse compared to yesterday?"  If getting worse, ask, "In what way?"     same 8. HIGH RISK DISEASE: "Do you have any chronic medical problems?" (e.g., asthma, heart or lung disease, weak immune system, etc.)     denies 9. PREGNANCY: "Is there any chance you are pregnant?" "When was your last menstrual period?"      10. OTHER SYMPTOMS: "Do you have any other symptoms?"  (e.g., chills, fatigue, headache, loss of smell or taste, muscle pain, sore throat)       Denies  Protocols used: CORONAVIRUS (COVID-19) DIAGNOSED OR SUSPECTED-A-AH

## 2018-10-15 ENCOUNTER — Telehealth: Payer: Self-pay | Admitting: *Deleted

## 2018-10-15 LAB — NOVEL CORONAVIRUS, NAA: SARS-CoV-2, NAA: NOT DETECTED

## 2018-10-15 NOTE — Telephone Encounter (Signed)
Copied from [place] [id number]. Topic: General - Other >> Oct 14, 2018  1:54 PM Rainey Pines A wrote: Patient returning JoAnns call and would like a callback once she gets in office

## 2018-10-15 NOTE — Telephone Encounter (Signed)
Patient called back and stated she had a pap with Dr Alwyn Pea in December which was normal.  This information was added to the pts health maintenance.

## 2018-10-15 NOTE — Telephone Encounter (Signed)
See prior phone note. 

## 2018-12-11 ENCOUNTER — Other Ambulatory Visit: Payer: Self-pay

## 2018-12-11 ENCOUNTER — Ambulatory Visit (INDEPENDENT_AMBULATORY_CARE_PROVIDER_SITE_OTHER): Payer: Managed Care, Other (non HMO) | Admitting: Family Medicine

## 2018-12-11 ENCOUNTER — Encounter: Payer: Self-pay | Admitting: Family Medicine

## 2018-12-11 DIAGNOSIS — J302 Other seasonal allergic rhinitis: Secondary | ICD-10-CM

## 2018-12-11 DIAGNOSIS — J45991 Cough variant asthma: Secondary | ICD-10-CM | POA: Diagnosis not present
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
## 2018-12-14 ENCOUNTER — Telehealth: Payer: Self-pay | Admitting: *Deleted

## 2018-12-14 NOTE — Telephone Encounter (Signed)
Appt scheduled for 9/9 at 10:30am.

## 2018-12-14 NOTE — Telephone Encounter (Signed)
-----   Message from Caren Macadam, MD sent at 12/11/2018  1:31 PM EDT ----- Please set up physical for her. Needs this before end of September. In office please!

## 2018-12-22 ENCOUNTER — Telehealth: Payer: Self-pay | Admitting: *Deleted

## 2018-12-22 NOTE — Telephone Encounter (Signed)
Left a message to call the office for screening questions prior to appt on 9/9.

## 2018-12-22 NOTE — Telephone Encounter (Signed)
Copied from [place] [id number]. Topic: General - Other >> Dec 22, 2018  2:38 PM Rayann Heman wrote: Reason for CRM: pt called and stated that she missed call for screening questions and would like a call back. Please advise

## 2018-12-23 ENCOUNTER — Ambulatory Visit (INDEPENDENT_AMBULATORY_CARE_PROVIDER_SITE_OTHER): Payer: Managed Care, Other (non HMO) | Admitting: Family Medicine

## 2018-12-23 ENCOUNTER — Other Ambulatory Visit: Payer: Self-pay

## 2018-12-23 ENCOUNTER — Encounter: Payer: Self-pay | Admitting: Family Medicine

## 2018-12-23 VITALS — BP 102/60 | HR 67 | Temp 97.6°F | Ht 61.0 in | Wt 189.7 lb

## 2018-12-23 DIAGNOSIS — Z13220 Encounter for screening for lipoid disorders: Secondary | ICD-10-CM | POA: Diagnosis not present

## 2018-12-23 DIAGNOSIS — R5383 Other fatigue: Secondary | ICD-10-CM | POA: Diagnosis not present

## 2018-12-23 DIAGNOSIS — Z0000 Encounter for general adult medical examination without abnormal findings: Secondary | ICD-10-CM | POA: Diagnosis not present

## 2018-12-23 DIAGNOSIS — D72829 Elevated white blood cell count, unspecified: Secondary | ICD-10-CM

## 2018-12-23 DIAGNOSIS — Z131 Encounter for screening for diabetes mellitus: Secondary | ICD-10-CM | POA: Diagnosis not present

## 2018-12-23 DIAGNOSIS — J302 Other seasonal allergic rhinitis: Secondary | ICD-10-CM | POA: Diagnosis not present

## 2018-12-23 DIAGNOSIS — J45991 Cough variant asthma: Secondary | ICD-10-CM | POA: Diagnosis not present

## 2018-12-23 LAB — CBC WITH DIFFERENTIAL/PLATELET
Basophils Absolute: 0.1 10*3/uL (ref 0.0–0.1)
Basophils Relative: 0.5 % (ref 0.0–3.0)
Eosinophils Absolute: 0.3 10*3/uL (ref 0.0–0.7)
Eosinophils Relative: 2.5 % (ref 0.0–5.0)
HCT: 37.3 % (ref 36.0–46.0)
Hemoglobin: 12.4 g/dL (ref 12.0–15.0)
Lymphocytes Relative: 32.2 % (ref 12.0–46.0)
Lymphs Abs: 3.7 10*3/uL (ref 0.7–4.0)
MCHC: 33.3 g/dL (ref 30.0–36.0)
MCV: 91.9 fl (ref 78.0–100.0)
Monocytes Absolute: 0.6 10*3/uL (ref 0.1–1.0)
Monocytes Relative: 5.3 % (ref 3.0–12.0)
Neutro Abs: 6.9 10*3/uL (ref 1.4–7.7)
Neutrophils Relative %: 59.5 % (ref 43.0–77.0)
Platelets: 296 10*3/uL (ref 150.0–400.0)
RBC: 4.06 Mil/uL (ref 3.87–5.11)
RDW: 12.8 % (ref 11.5–15.5)
WBC: 11.5 10*3/uL — ABNORMAL HIGH (ref 4.0–10.5)

## 2018-12-23 LAB — LIPID PANEL
Cholesterol: 181 mg/dL (ref 0–200)
HDL: 50.8 mg/dL (ref >39.00)
LDL Cholesterol: 114 mg/dL — ABNORMAL HIGH (ref 0–99)
NonHDL: 130.59
Total CHOL/HDL Ratio: 4
Triglycerides: 83 mg/dL (ref 0.0–149.0)
VLDL: 16.6 mg/dL (ref 0.0–40.0)

## 2018-12-23 LAB — COMPREHENSIVE METABOLIC PANEL
ALT: 16 U/L (ref 0–35)
AST: 15 U/L (ref 0–37)
Albumin: 4 g/dL (ref 3.5–5.2)
Alkaline Phosphatase: 84 U/L (ref 39–117)
BUN: 12 mg/dL (ref 6–23)
CO2: 26 mEq/L (ref 19–32)
Calcium: 9.6 mg/dL (ref 8.4–10.5)
Chloride: 104 mEq/L (ref 96–112)
Creatinine, Ser: 1 mg/dL (ref 0.40–1.20)
GFR: 73.01 mL/min (ref >60.00)
Glucose, Bld: 86 mg/dL (ref 70–99)
Potassium: 4.3 mEq/L (ref 3.5–5.1)
Sodium: 138 mEq/L (ref 135–145)
Total Bilirubin: 0.4 mg/dL (ref 0.2–1.2)
Total Protein: 7.3 g/dL (ref 6.0–8.3)

## 2018-12-23 LAB — TSH: TSH: 1.51 u[IU]/mL (ref 0.35–4.50)

## 2018-12-23 LAB — VITAMIN D 25 HYDROXY (VIT D DEFICIENCY, FRACTURES): VITD: 22.69 ng/mL — ABNORMAL LOW (ref 30.00–100.00)

## 2018-12-23 LAB — VITAMIN B12: Vitamin B-12: 600 pg/mL (ref 211–911)
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
## 2018-12-23 NOTE — Patient Instructions (Signed)
Count calories with tool like myfitness pal. Shoot for about 1500 cal/day. (typically female 1800/day to maintain) (1200 lowest). You do not get to add back in calories that you burn exercising.

## 2018-12-25 ENCOUNTER — Other Ambulatory Visit: Payer: Self-pay

## 2018-12-25 ENCOUNTER — Ambulatory Visit: Payer: Managed Care, Other (non HMO) | Admitting: Allergy

## 2018-12-25 ENCOUNTER — Encounter: Payer: Self-pay | Admitting: Allergy

## 2018-12-25 VITALS — BP 112/68 | HR 68 | Temp 97.5°F | Resp 16 | Ht 61.0 in | Wt 191.0 lb

## 2018-12-25 DIAGNOSIS — R062 Wheezing: Secondary | ICD-10-CM | POA: Diagnosis not present

## 2018-12-25 DIAGNOSIS — T781XXD Other adverse food reactions, not elsewhere classified, subsequent encounter: Secondary | ICD-10-CM

## 2018-12-25 DIAGNOSIS — J3089 Other allergic rhinitis: Secondary | ICD-10-CM | POA: Diagnosis not present

## 2018-12-25 DIAGNOSIS — H1013 Acute atopic conjunctivitis, bilateral: Secondary | ICD-10-CM | POA: Diagnosis not present

## 2018-12-25 NOTE — Patient Instructions (Signed)
Allergies  - continue avoidance measures for trees, weeds, grasses, molds, dust mites, cockroach  - continue dymista 1 spray each nostril twice a day.  This is a combination nasal spray with Flonase + Astelin (nasal antihistamine).  This helps with both nasal congestion and drainage.   - continue Allegra 180mg  daily  - for itchy/watery/red eyes use Pazeo or Pataday 1 drop each eye daily as needed  - allergen immunotherapy discussed today including protocol, benefits and risk.  Informational handout provided.  If interested in this therapuetic option you can check with your insurance carrier for coverage.  Let us know if you would like to proceed with this option.    Wheezing - have access to albuterol inhaler 2 puffs every 4-6 hours as needed for cough/wheeze/shortness of breath/chest tightness.  May use 15-20 minutes prior to activity.   Monitor frequency of use.    Pollen food allergy syndrome  - skin testing at initial visit for select foods is positive to carrots and negative for apples and peaches  - The oral allergy syndrome (OAS) or pollen-food allergy syndrome (PFAS) is a relatively common form of food allergy, particularly in adults. It typically occurs in people who have pollen allergies when the immune system "sees" proteins on the food that look like proteins on the pollen. This results in the allergy antibody (IgE) binding to the food instead of the pollen. Patients typically report itching and/or mild swelling of the mouth and throat immediately following ingestion of certain uncooked fruits (including nuts) or raw vegetables. Only a very small number of affected individuals experience systemic allergic reactions, such as anaphylaxis which occurs with true food allergies.        Follow-up 6 months or sooner if needed

## 2018-12-25 NOTE — Progress Notes (Signed)
Follow-up Note  RE: Elly Modenayana S. Carmon Simon MRN: [id number] DOB: Oct 30, 1975 Date of Office Visit: 12/25/2018   History of present illness: Rebecca Simon is a 43 y.o. female presenting today for follow-up of allergic rhinitis with conjunctivitis with PFAS and wheezing.  She was last seen in the office on 06/19/2018 by myself.  She states she has been doing better since her last visit.  She states the dymista does help do reduce nasal congestion and drainage however she does have cough and PND still but it is not as frequent as before.  She is also taking Allegra daily.  She states she only needed to use her pataday only during spring.   She states she sometimes will eat carrots in like salads and will have mild mouth itch.   She states she has not had any significant wheezing since last visit but states she did use albuterol last week which was the first time she's needed to use since last visit.  She denies any ED/UC visits or systemic steroid needs.    Review of systems: Review of Systems  Constitutional: Negative for chills, fever and malaise/fatigue.  HENT: Negative for congestion, ear discharge, nosebleeds and sore throat.   Eyes: Negative for pain, discharge and redness.  Respiratory: Negative for cough, shortness of breath and wheezing.   Cardiovascular: Negative for chest pain.  Gastrointestinal: Negative for abdominal pain, constipation, diarrhea, heartburn, nausea and vomiting.  Musculoskeletal: Negative for joint pain.  Skin: Negative for itching and rash.  Neurological: Negative for headaches.    All other systems negative unless noted above in HPI  Past medical/social/surgical/family history have been reviewed and are unchanged unless specifically indicated below.  No changes  Medication List: Allergies as of 12/25/2018      Reactions   Sulfa Antibiotics Anaphylaxis, Swelling   Vicodin [hydrocodone-acetaminophen] Other (See Comments)   Nightmares, visual disturbances     Medication List       Accurate as of December 25, 2018  4:27 PM. If you have any questions, ask your nurse or doctor.        Azelastine-Fluticasone 137-50 MCG/ACT Susp Commonly known as: Dymista Place 1 spray into the nose 2 (two) times daily.   fexofenadine 180 MG tablet Commonly known as: ALLEGRA Take 180 mg by mouth daily.   Lo Loestrin Fe 1 MG-10 MCG / 10 MCG tablet Generic drug: Norethindrone-Ethinyl Estradiol-Fe Biphas Take 1 tablet by mouth every evening.   MULTIVITAMIN PO Take by mouth.   Olopatadine HCl 0.2 % Soln Commonly known as: Pataday Place 1 drop into both eyes 1 day or 1 dose.   ProAir RespiClick 108 (90 Base) MCG/ACT Aepb Generic drug: Albuterol Sulfate Inhale 2 puffs into the lungs every 4 (four) hours as needed.       Known medication allergies: Allergies  Allergen Reactions  . Sulfa Antibiotics Anaphylaxis and Swelling  . Vicodin [Hydrocodone-Acetaminophen] Other (See Comments)    Nightmares, visual disturbances     Physical examination: Blood pressure 112/68, pulse 68, temperature (!) 97.5 F (36.4 C), temperature source Temporal, resp. rate 16, height 5\' 1"  (1.549 m), weight 191 lb (86.6 kg), last menstrual period 11/28/2018, SpO2 99 %.  General: Alert, interactive, in no acute distress. HEENT: PERRLA, TMs pearly gray, turbinates minimally edematous without discharge, post-pharynx non erythematous. Neck: Supple without lymphadenopathy. Lungs: Clear to auscultation without wheezing, rhonchi or rales. {no increased work of breathing. CV: Normal S1, S2 without murmurs. Abdomen: Nondistended, nontender. Skin: Warm and dry, without  lesions or rashes. Extremities:  No clubbing, cyanosis or edema. Neuro:   Grossly intact.  Diagnositics/Labs: None today  Assessment and plan:   Allergic rhinitis with conjunctiviits  - continue avoidance measures for trees, weeds, grasses, molds, dust mites, cockroach  - continue dymista 1 spray each  nostril twice a day.  This is a combination nasal spray with Flonase + Astelin (nasal antihistamine).  This helps with both nasal congestion and drainage.   - continue Allegra 180mg  daily  - for itchy/watery/red eyes use Pazeo or Pataday 1 drop each eye daily as needed  - consider adding singulair if remains symptomatic  - allergen immunotherapy discussed today including protocol, benefits and risk.  Informational handout provided.  If interested in this therapuetic option you can check with your insurance carrier for coverage.  Let us know if you would like to proceed with this option.    Wheezing - have access to albuterol inhaler 2 puffs every 4-6 hours as needed for cough/wheeze/shortness of breath/chest tightness.  May use 15-20 minutes prior to activity.   Monitor frequency of use.    Pollen food allergy syndrome  - skin testing at initial visit for select foods is positive to carrots and negative for apples and peaches  - The oral allergy syndrome (OAS) or pollen-food allergy syndrome (PFAS) is a relatively common form of food allergy, particularly in adults. It typically occurs in people who have pollen allergies when the immune system "sees" proteins on the food that look like proteins on the pollen. This results in the allergy antibody (IgE) binding to the food instead of the pollen. Patients typically report itching and/or mild swelling of the mouth and throat immediately following ingestion of certain uncooked fruits (including nuts) or raw vegetables. Only a very small number of affected individuals experience systemic allergic reactions, such as anaphylaxis which occurs with true food allergies.    Follow-up 6 months or sooner if needed  I appreciate the opportunity to take part in Laynie's care. Please do not hesitate to contact me with questions.  Sincerely,   Prudy Feeler, MD Allergy/Immunology [facility]

## 2018-12-28 NOTE — Addendum Note (Signed)
Addended by: Agnes Lawrence on: 12/28/2018 04:27 PM   Modules accepted: Orders

## 2019-03-29 ENCOUNTER — Other Ambulatory Visit: Payer: Managed Care, Other (non HMO)

## 2019-04-01 ENCOUNTER — Other Ambulatory Visit: Payer: Self-pay

## 2019-04-01 ENCOUNTER — Other Ambulatory Visit (INDEPENDENT_AMBULATORY_CARE_PROVIDER_SITE_OTHER): Payer: Managed Care, Other (non HMO)

## 2019-04-01 DIAGNOSIS — D72829 Elevated white blood cell count, unspecified: Secondary | ICD-10-CM | POA: Diagnosis not present

## 2019-04-01 LAB — CBC WITH DIFFERENTIAL/PLATELET
Basophils Absolute: 0.1 10*3/uL (ref 0.0–0.1)
Basophils Relative: 0.5 % (ref 0.0–3.0)
Eosinophils Absolute: 0.2 10*3/uL (ref 0.0–0.7)
Eosinophils Relative: 2.1 % (ref 0.0–5.0)
HCT: 37.1 % (ref 36.0–46.0)
Hemoglobin: 12.3 g/dL (ref 12.0–15.0)
Lymphocytes Relative: 26.7 % (ref 12.0–46.0)
Lymphs Abs: 3.1 10*3/uL (ref 0.7–4.0)
MCHC: 33.2 g/dL (ref 30.0–36.0)
MCV: 91.2 fl (ref 78.0–100.0)
Monocytes Absolute: 0.6 10*3/uL (ref 0.1–1.0)
Monocytes Relative: 5.3 % (ref 3.0–12.0)
Neutro Abs: 7.7 10*3/uL (ref 1.4–7.7)
Neutrophils Relative %: 65.4 % (ref 43.0–77.0)
Platelets: 261 10*3/uL (ref 150.0–400.0)
RBC: 4.07 Mil/uL (ref 3.87–5.11)
RDW: 12.6 % (ref 11.5–15.5)
WBC: 11.7 10*3/uL — ABNORMAL HIGH (ref 4.0–10.5)

## 2019-06-22 ENCOUNTER — Other Ambulatory Visit: Payer: Self-pay

## 2019-06-23 ENCOUNTER — Encounter: Payer: Self-pay | Admitting: Family Medicine

## 2019-06-23 ENCOUNTER — Ambulatory Visit (INDEPENDENT_AMBULATORY_CARE_PROVIDER_SITE_OTHER): Payer: BC Managed Care – PPO | Admitting: Family Medicine

## 2019-06-23 VITALS — BP 100/64 | HR 92 | Temp 97.2°F | Ht 61.0 in | Wt 186.6 lb

## 2019-06-23 DIAGNOSIS — Z841 Family history of disorders of kidney and ureter: Secondary | ICD-10-CM | POA: Diagnosis not present

## 2019-06-23 DIAGNOSIS — D72829 Elevated white blood cell count, unspecified: Secondary | ICD-10-CM | POA: Diagnosis not present

## 2019-06-23 DIAGNOSIS — J302 Other seasonal allergic rhinitis: Secondary | ICD-10-CM

## 2019-06-23 DIAGNOSIS — H6123 Impacted cerumen, bilateral: Secondary | ICD-10-CM

## 2019-06-23 DIAGNOSIS — Z0184 Encounter for antibody response examination: Secondary | ICD-10-CM | POA: Diagnosis not present

## 2019-06-23 DIAGNOSIS — J4540 Moderate persistent asthma, uncomplicated: Secondary | ICD-10-CM

## 2019-06-23 DIAGNOSIS — Z20822 Contact with and (suspected) exposure to covid-19: Secondary | ICD-10-CM

## 2019-06-23 LAB — CBC WITH DIFFERENTIAL/PLATELET
Basophils Absolute: 0.1 10*3/uL (ref 0.0–0.1)
Basophils Relative: 0.6 % (ref 0.0–3.0)
Eosinophils Absolute: 0.3 10*3/uL (ref 0.0–0.7)
Eosinophils Relative: 2.9 % (ref 0.0–5.0)
HCT: 38.4 % (ref 36.0–46.0)
Hemoglobin: 12.7 g/dL (ref 12.0–15.0)
Lymphocytes Relative: 24.4 % (ref 12.0–46.0)
Lymphs Abs: 2.8 10*3/uL (ref 0.7–4.0)
MCHC: 33.2 g/dL (ref 30.0–36.0)
MCV: 91.9 fl (ref 78.0–100.0)
Monocytes Absolute: 0.6 10*3/uL (ref 0.1–1.0)
Monocytes Relative: 5 % (ref 3.0–12.0)
Neutro Abs: 7.8 10*3/uL — ABNORMAL HIGH (ref 1.4–7.7)
Neutrophils Relative %: 67.1 % (ref 43.0–77.0)
Platelets: 314 10*3/uL (ref 150.0–400.0)
RBC: 4.17 Mil/uL (ref 3.87–5.11)
RDW: 12.6 % (ref 11.5–15.5)
WBC: 11.6 10*3/uL — ABNORMAL HIGH (ref 4.0–10.5)

## 2019-06-23 LAB — URINALYSIS, ROUTINE W REFLEX MICROSCOPIC
Bilirubin Urine: NEGATIVE
Ketones, ur: NEGATIVE
Leukocytes,Ua: NEGATIVE
Nitrite: NEGATIVE
Specific Gravity, Urine: 1.02 (ref 1.000–1.030)
Total Protein, Urine: NEGATIVE
Urine Glucose: NEGATIVE
Urobilinogen, UA: 0.2 (ref 0.0–1.0)
pH: 6 (ref 5.0–8.0)

## 2019-06-23 LAB — BASIC METABOLIC PANEL
BUN: 11 mg/dL (ref 6–23)
CO2: 26 mEq/L (ref 19–32)
Calcium: 9.2 mg/dL (ref 8.4–10.5)
Chloride: 106 mEq/L (ref 96–112)
Creatinine, Ser: 1.01 mg/dL (ref 0.40–1.20)
GFR: 72.01 mL/min (ref >60.00)
Glucose, Bld: 99 mg/dL (ref 70–99)
Potassium: 4.4 mEq/L (ref 3.5–5.1)
Sodium: 139 mEq/L (ref 135–145)

## 2019-06-23 LAB — SARS-COV-2 IGG: SARS-COV-2 IgG: 0.03

## 2019-06-23 MED ORDER — BUDESONIDE-FORMOTEROL FUMARATE 80-4.5 MCG/ACT IN AERO: 2.0000 | INHALATION_SPRAY | Freq: Two times a day (BID) | RESPIRATORY_TRACT | 3 refills | Status: DC

## 2019-06-23 MED ORDER — AEROCHAMBER PLUS FLO-VU SMALL MISC: 1.0000 | Freq: Once | 0 refills | Status: AC

## 2019-06-23 NOTE — Progress Notes (Signed)
Kenyatta S. O'Neil DOB: 10-21-1975 Encounter date: 06/23/2019  This is a 44 y.o. female who presents with No chief complaint on file.   History of present illness:  Allergies/cough variant asthma: working with Dr. Mora Appl had recommended allergy shots which she deferred to 2021. She is going on Friday to allergist to start shots. Cough worse at night. Has had to use inhaler at least once daily in last few weeks.   Sick in Feb - nausea, vomiting, diarrhea and really sick for 4 days. COVID testing was negative. Had a little vertigo when sick; now feels a subtle dizziness that just remains. Took her a couple of weeks to get back to eating regularly. No fevers. Nasal drip more than sinus pressure/pain. Taking natural allergy remedies because everything else wasn't working.  At last visit working on healthier eating.   Elevated wbc with 03/2019 labs; has been elevated through last 6 years.    Allergies  Allergen Reactions  . Sulfa Antibiotics Anaphylaxis and Swelling  . Vicodin [Hydrocodone-Acetaminophen] Other (See Comments)    Nightmares, visual disturbances   Current Meds  Medication Sig  . Albuterol Sulfate (PROAIR RESPICLICK) 108 (90 Base) MCG/ACT AEPB Inhale 2 puffs into the lungs every 4 (four) hours as needed.  . Azelastine-Fluticasone (DYMISTA) 137-50 MCG/ACT SUSP Place 1 spray into the nose 2 (two) times daily.  . fexofenadine (ALLEGRA) 180 MG tablet Take 180 mg by mouth daily.  . Multiple Vitamin (MULTIVITAMIN PO) Take by mouth.  . Norethindrone-Ethinyl Estradiol-Fe Biphas (LO LOESTRIN FE) 1 MG-10 MCG / 10 MCG tablet Take 1 tablet by mouth every evening.  . Olopatadine HCl (PATADAY) 0.2 % SOLN Place 1 drop into both eyes 1 day or 1 dose.    Review of Systems  Constitutional: Negative for chills, fatigue and fever.  Respiratory: Positive for cough and chest tightness (heaviness; notes more in evening. albuterol does help). Negative for shortness of breath and wheezing.    Cardiovascular: Negative for chest pain, palpitations and leg swelling.    Objective:  BP 100/64 (BP Location: Left Arm, Patient Position: Sitting, Cuff Size: Large)   Pulse 92   Temp (!) 97.2 F (36.2 C) (Temporal)   Ht 5\' 1"  (1.549 m)   Wt 186 lb 9.6 oz (84.6 kg)   SpO2 98%   BMI 35.26 kg/m   Weight: 186 lb 9.6 oz (84.6 kg)   BP Readings from Last 3 Encounters:  06/23/19 100/64  12/25/18 112/68  12/23/18 102/60   Wt Readings from Last 3 Encounters:  06/23/19 186 lb 9.6 oz (84.6 kg)  12/25/18 191 lb (86.6 kg)  12/23/18 189 lb 11.2 oz (86 kg)    Physical Exam Constitutional:      General: She is not in acute distress.    Appearance: She is well-developed.  Cardiovascular:     Rate and Rhythm: Normal rate and regular rhythm.     Heart sounds: Normal heart sounds. No murmur. No friction rub.  Pulmonary:     Effort: Pulmonary effort is normal. No respiratory distress.     Breath sounds: Normal breath sounds. No wheezing or rales.  Musculoskeletal:     Right lower leg: No edema.     Left lower leg: No edema.  Neurological:     Mental Status: She is alert and oriented to person, place, and time.  Psychiatric:        Behavior: Behavior normal.     Assessment/Plan  1. Seasonal allergies Will begin allergy injections soon.  I  am hopeful that this will also help with her asthma symptoms.  Let me know if any worsening of sinus symptoms in the meanwhile.  2. Leukocytosis, unspecified type This appears to been stable over the last 6 years.  Differential has been stable. - CBC with Differential/Platelet; Future  3. Exposure to COVID-19 virus - SAR CoV2 Serology (COVID 19)AB(IGG)IA; Future  4. Family history of kidney disease - Urinalysis; Future - Basic metabolic panel; Future  5. Moderate persistent asthma, unspecified whether complicated We will start Symbicort inhaler 2 puffs twice daily.  She is using albuterol on a daily/nightly basis, so I am hoping to  eliminate the need for regular albuterol use. - budesonide-formoterol (SYMBICORT) 80-4.5 MCG/ACT inhaler; Inhale 2 puffs into the lungs 2 (two) times daily.  Dispense: 1 Inhaler; Refill: 3 - Spacer/Aero-Holding Chambers (AEROCHAMBER PLUS FLO-VU SMALL) MISC; 1 each by Other route once for 1 dose.  Dispense: 1 each; Refill: 0  6. Bilateral impacted cerumen Resolved in office today with curette.   Return in about 3 months (around 09/23/2019) for Chronic condition visit-asthma recheck.     Micheline Rough, MD

## 2019-06-23 NOTE — Addendum Note (Signed)
Addended by: Rossie Muskrat K on: 06/23/2019 10:14 AM   Modules accepted: Orders

## 2019-06-23 NOTE — Addendum Note (Signed)
Addended by: Bonnye Fava on: 06/23/2019 10:18 AM   Modules accepted: Orders

## 2019-06-25 ENCOUNTER — Other Ambulatory Visit: Payer: Self-pay

## 2019-06-25 ENCOUNTER — Encounter: Payer: Self-pay | Admitting: Allergy

## 2019-06-25 ENCOUNTER — Ambulatory Visit: Payer: BC Managed Care – PPO | Admitting: Allergy

## 2019-06-25 VITALS — BP 124/82 | HR 79 | Temp 97.7°F | Resp 16

## 2019-06-25 DIAGNOSIS — H1013 Acute atopic conjunctivitis, bilateral: Secondary | ICD-10-CM

## 2019-06-25 DIAGNOSIS — Z7189 Other specified counseling: Secondary | ICD-10-CM

## 2019-06-25 DIAGNOSIS — J3089 Other allergic rhinitis: Secondary | ICD-10-CM

## 2019-06-25 DIAGNOSIS — T781XXD Other adverse food reactions, not elsewhere classified, subsequent encounter: Secondary | ICD-10-CM

## 2019-06-25 DIAGNOSIS — J4530 Mild persistent asthma, uncomplicated: Secondary | ICD-10-CM | POA: Diagnosis not present

## 2019-06-25 DIAGNOSIS — Z7185 Encounter for immunization safety counseling: Secondary | ICD-10-CM

## 2019-06-25 NOTE — Progress Notes (Signed)
Follow-up Note  RE: Rebecca Simon. Hink MRN: [id number] DOB: 1976-01-10 Date of Office Visit: 06/25/2019   History of present illness: Rebecca Simon is a 44 y.o. female presenting today for follow-up of allergic rhinitis with conjunctivitis, pollen food allergy syndrome and sneezing.  She was last seen in the office on 12/25/2018 by myself.  She states she has been doing relatively well since her last visit without any major health changes, surgeries or hospitalizations.  She states she has been having more nasal drainage and has been out of her Dymista for about 3 months now.  She states that this time she is interested in starting on allergen immunotherapy so that she does not have to be dependent on medication management.  She does continue on Allegra daily.  She will use an allergy based eyedrop either Pataday or Pazeo as needed for any ocular allergy symptoms. She states her PCP started her on Symbicort 80 mcg about 2 days ago that she has been using prior to bedtime.  She states she has been having more symptoms in the night including chest tightness and cough.  She does feel that since she has been on the Symbicort that she is having less of these nighttime symptoms.  Prior to starting on Symbicort she was using her albuterol at bedtime. She continues to avoid carrots, apples and peaches as these have called for allergy syndrome symptoms.     Review of systems: Review of Systems  Constitutional: Negative.   HENT:       See HPI  Eyes: Negative.   Respiratory:       See HPI  Cardiovascular: Negative.   Gastrointestinal: Negative.   Musculoskeletal: Negative.   Skin: Negative.   Neurological: Negative.     All other systems negative unless noted above in HPI  Past medical/social/surgical/family history have been reviewed and are unchanged unless specifically indicated below.  No changes  Medication List: Current Outpatient Medications  Medication Sig Dispense Refill  .  Albuterol Sulfate (PROAIR RESPICLICK) 108 (90 Base) MCG/ACT AEPB Inhale 2 puffs into the lungs every 4 (four) hours as needed. 2 each 3  . budesonide-formoterol (SYMBICORT) 80-4.5 MCG/ACT inhaler Inhale 2 puffs into the lungs 2 (two) times daily. 1 Inhaler 3  . Multiple Vitamin (MULTIVITAMIN PO) Take by mouth.    . Norethindrone-Ethinyl Estradiol-Fe Biphas (LO LOESTRIN FE) 1 MG-10 MCG / 10 MCG tablet Take 1 tablet by mouth every evening.     No current facility-administered medications for this visit.     Known medication allergies: Allergies  Allergen Reactions  . Sulfa Antibiotics Anaphylaxis and Swelling  . Vicodin [Hydrocodone-Acetaminophen] Other (See Comments)    Nightmares, visual disturbances     Physical examination: Blood pressure 124/82, pulse 79, temperature 97.7 F (36.5 C), temperature source Temporal, resp. rate 16, SpO2 97 %.  General: Alert, interactive, in no acute distress. HEENT: TMs pearly gray, turbinates moderately edematous with clear discharge, post-pharynx non erythematous. Neck: Supple without lymphadenopathy. Lungs: Clear to auscultation without wheezing, rhonchi or rales. {no increased work of breathing. CV: Normal S1, S2 without murmurs. Abdomen: Nondistended, nontender. Skin: Warm and dry, without lesions or rashes. Extremities:  No clubbing, cyanosis or edema. Neuro:   Grossly intact.  Diagnositics/Labs:  Spirometry: FEV1: 2.47 L 112%, FVC: 2.84 L 106%, ratio consistent with Nonobstructive pattern  Assessment and plan:   Allergic rhinitis with conjunctivitis  - continue avoidance measures for trees, weeds, grasses, molds, dust mites, cockroach  - continue dymista  1 spray each nostril twice a day.  We will refill this.  this is a combination nasal spray with Flonase + Astelin (nasal antihistamine).  This helps with both nasal congestion and drainage.   - continue Allegra 180mg  daily  - for itchy/watery/red eyes use Pazeo or Pataday 1 drop each  eye daily as needed  - allergen immunotherapy discussed today including protocol, benefits and risk.  Informational handout provided.  With normal spirometry and improved respiratory symptom control she is eligible to start immunotherapy.  Advised if she would like to start she can schedule a new start appointment.  Will provide with an epinephrine device at that time.  Reactive airway -Continue Symbicort 80 mcg 2 puffs at bedtime.  Advised if symptoms worsen or she is not meeting the below goals to make Symbicort 80 twice a day maintenance medication.  Also advised that she can use Symbicort as an as needed for rescue of symptoms similar to albuterol with the updated guidelines. - have access to albuterol inhaler 2 puffs every 4-6 hours as needed for cough/wheeze/shortness of breath/chest tightness.  May use 15-20 minutes prior to activity.   Monitor frequency of use.    Pollen food allergy syndrome  - skin testing at initial visit for select foods is positive to carrots and negative for apples and peaches  - The oral allergy syndrome (OAS) or pollen-food allergy syndrome (PFAS) is a relatively common form of food allergy, particularly in adults. It typically occurs in people who have pollen allergies when the immune system "sees" proteins on the food that look like proteins on the pollen. This results in the allergy antibody (IgE) binding to the food instead of the pollen. Patients typically report itching and/or mild swelling of the mouth and throat immediately following ingestion of certain uncooked fruits (including nuts) or raw vegetables. Only a very small number of affected individuals experience systemic allergic reactions, such as anaphylaxis which occurs with true food allergies.    Vaccine counseling -Discussed the Covid vaccine options available at this time.  Advised that she does not appear to have any contraindications to receiving any of the available vaccines at this time.  She has not  had any reactions following a previous vaccines that is IgE mediated.  She does not believe she is had any polyethylene glycol products in the past.  I counseled that the benefits of receiving the vaccine outweigh the risk associated with the vaccine or the risk with getting Covid illness this she would be low risk.  She would like to receive the vaccine when her eligibility arrives.  Follow-up 4 months or sooner if needed  I appreciate the opportunity to take part in Sheryn's care. Please do not hesitate to contact me with questions.  Sincerely,   Prudy Feeler, MD Allergy/Immunology [facility]

## 2019-06-25 NOTE — Patient Instructions (Addendum)
Allergies  - continue avoidance measures for trees, weeds, grasses, molds, dust mites, cockroach  - continue dymista 1 spray each nostril twice a day.  We will refill this.  this is a combination nasal spray with Flonase + Astelin (nasal antihistamine).  This helps with both nasal congestion and drainage.   - continue Allegra 180mg  daily  - for itchy/watery/red eyes use Pazeo or Pataday 1 drop each eye daily as needed  - allergen immunotherapy discussed today including protocol, benefits and risk.  Informational handout provided.  With normal spirometry and improved respiratory symptom control she is eligible to start immunotherapy.  Advised if she would like to start she can schedule a new start appointment.  Will provide with an epinephrine device at that time.  Reactive airway -Continue Symbicort 80 mcg 2 puffs at bedtime.  Advised if symptoms worsen or she is not meeting the below goals to make Symbicort 80 twice a day maintenance medication.  Also advised that she can use Symbicort as an as needed for rescue of symptoms similar to albuterol with the updated guidelines. - have access to albuterol inhaler 2 puffs every 4-6 hours as needed for cough/wheeze/shortness of breath/chest tightness.  May use 15-20 minutes prior to activity.   Monitor frequency of use.    Pollen food allergy syndrome  - skin testing at initial visit for select foods is positive to carrots and negative for apples and peaches  - The oral allergy syndrome (OAS) or pollen-food allergy syndrome (PFAS) is a relatively common form of food allergy, particularly in adults. It typically occurs in people who have pollen allergies when the immune system "sees" proteins on the food that look like proteins on the pollen. This results in the allergy antibody (IgE) binding to the food instead of the pollen. Patients typically report itching and/or mild swelling of the mouth and throat immediately following ingestion of certain uncooked  fruits (including nuts) or raw vegetables. Only a very small number of affected individuals experience systemic allergic reactions, such as anaphylaxis which occurs with true food allergies.        Follow-up 4 months or sooner if needed

## 2019-06-26 MED ORDER — AZELASTINE-FLUTICASONE 137-50 MCG/ACT NA SUSP: 1.0000 | Freq: Two times a day (BID) | NASAL | 5 refills | Status: DC

## 2019-06-28 ENCOUNTER — Other Ambulatory Visit: Payer: Self-pay | Admitting: *Deleted

## 2019-06-30 ENCOUNTER — Telehealth: Payer: Self-pay | Admitting: *Deleted

## 2019-06-30 NOTE — Telephone Encounter (Signed)
A PA has been submitted via fax to [phone] for generic Dymista and is currently waiting approval or denial.

## 2019-07-01 DIAGNOSIS — J3081 Allergic rhinitis due to animal (cat) (dog) hair and dander: Secondary | ICD-10-CM | POA: Diagnosis not present

## 2019-07-01 NOTE — Progress Notes (Signed)
VIALS EXP 06-30-20 

## 2019-07-01 NOTE — Addendum Note (Signed)
Addended by: Lorrin Mais on: 07/01/2019 01:36 PM   Modules accepted: Orders

## 2019-07-02 MED ORDER — FLUTICASONE PROPIONATE 50 MCG/ACT NA SUSP: 2.0000 | Freq: Every day | NASAL | 5 refills | Status: DC

## 2019-07-02 MED ORDER — AZELASTINE HCL 0.1 % NA SOLN: 2.0000 | Freq: Two times a day (BID) | NASAL | 12 refills | Status: DC

## 2019-07-02 NOTE — Telephone Encounter (Signed)
Fax from [employer] [place]:  Generic Dymista has been denied.  Medication will need to be split.

## 2019-07-02 NOTE — Telephone Encounter (Signed)
Call to patient to make her aware that medication was denied and we had to split the medications. Explained how to properly use the nasal sprays and the dosage.  Pt verbalized understanding, call ended.

## 2019-07-02 NOTE — Addendum Note (Signed)
Addended by: Teressa Senter on: 07/02/2019 12:02 PM   Modules accepted: Orders

## 2019-07-05 DIAGNOSIS — J3089 Other allergic rhinitis: Secondary | ICD-10-CM | POA: Diagnosis not present

## 2019-07-13 DIAGNOSIS — Z1231 Encounter for screening mammogram for malignant neoplasm of breast: Secondary | ICD-10-CM | POA: Diagnosis not present

## 2019-07-13 DIAGNOSIS — Z1239 Encounter for other screening for malignant neoplasm of breast: Secondary | ICD-10-CM | POA: Diagnosis not present

## 2019-07-13 DIAGNOSIS — Z124 Encounter for screening for malignant neoplasm of cervix: Secondary | ICD-10-CM | POA: Diagnosis not present

## 2019-07-13 DIAGNOSIS — Z01419 Encounter for gynecological examination (general) (routine) without abnormal findings: Secondary | ICD-10-CM | POA: Diagnosis not present

## 2019-07-13 DIAGNOSIS — Z3040 Encounter for surveillance of contraceptives, unspecified: Secondary | ICD-10-CM | POA: Diagnosis not present

## 2019-07-21 ENCOUNTER — Ambulatory Visit: Payer: BC Managed Care – PPO

## 2019-07-29 ENCOUNTER — Other Ambulatory Visit: Payer: Self-pay

## 2019-07-29 ENCOUNTER — Ambulatory Visit (INDEPENDENT_AMBULATORY_CARE_PROVIDER_SITE_OTHER): Payer: BC Managed Care – PPO

## 2019-07-29 ENCOUNTER — Ambulatory Visit: Payer: BC Managed Care – PPO

## 2019-07-29 DIAGNOSIS — J3089 Other allergic rhinitis: Secondary | ICD-10-CM

## 2019-07-29 MED ORDER — EPINEPHRINE 0.3 MG/0.3ML IJ SOAJ: 0.3000 mg | Freq: Once | INTRAMUSCULAR | 1 refills | Status: AC

## 2019-07-29 NOTE — Progress Notes (Signed)
Immunotherapy   Patient Details  Name: Rebecca Simon MRN: [id number] Date of Birth: [birth year]  07/29/2019  Rebecca Simon started injections for  Blue vials on Pollen-Pet and Mold-DM-CR. Patient was given the instructions and to wait 30 mins in an Exam Room. Patient tolerated injection well with out any complaints of pain or discomfort.  Following schedule: B  Frequency:1 time per week Epi-Pen:Prescription for Epi-Pen given Consent signed and patient instructions given.   Luiz Ochoa Ataya Murdy 07/29/2019, 10:23 AM

## 2019-08-13 ENCOUNTER — Ambulatory Visit (INDEPENDENT_AMBULATORY_CARE_PROVIDER_SITE_OTHER): Payer: BC Managed Care – PPO

## 2019-08-13 DIAGNOSIS — J3089 Other allergic rhinitis: Secondary | ICD-10-CM

## 2019-08-19 ENCOUNTER — Ambulatory Visit (INDEPENDENT_AMBULATORY_CARE_PROVIDER_SITE_OTHER): Payer: BC Managed Care – PPO

## 2019-08-19 DIAGNOSIS — J309 Allergic rhinitis, unspecified: Secondary | ICD-10-CM

## 2019-08-26 ENCOUNTER — Ambulatory Visit (INDEPENDENT_AMBULATORY_CARE_PROVIDER_SITE_OTHER): Payer: BC Managed Care – PPO

## 2019-08-26 DIAGNOSIS — J309 Allergic rhinitis, unspecified: Secondary | ICD-10-CM

## 2019-08-30 IMAGING — DX DG CHEST 2V
2 series · 2 of 2 positions shown · non-contrast
Comparison: PA and lateral chest x-ray April 08, 2015

CLINICAL DATA: Three-week history of mid chest pain with mild
cough. Former smoker.

EXAM:
CHEST - 2 VIEW

[chest pa]
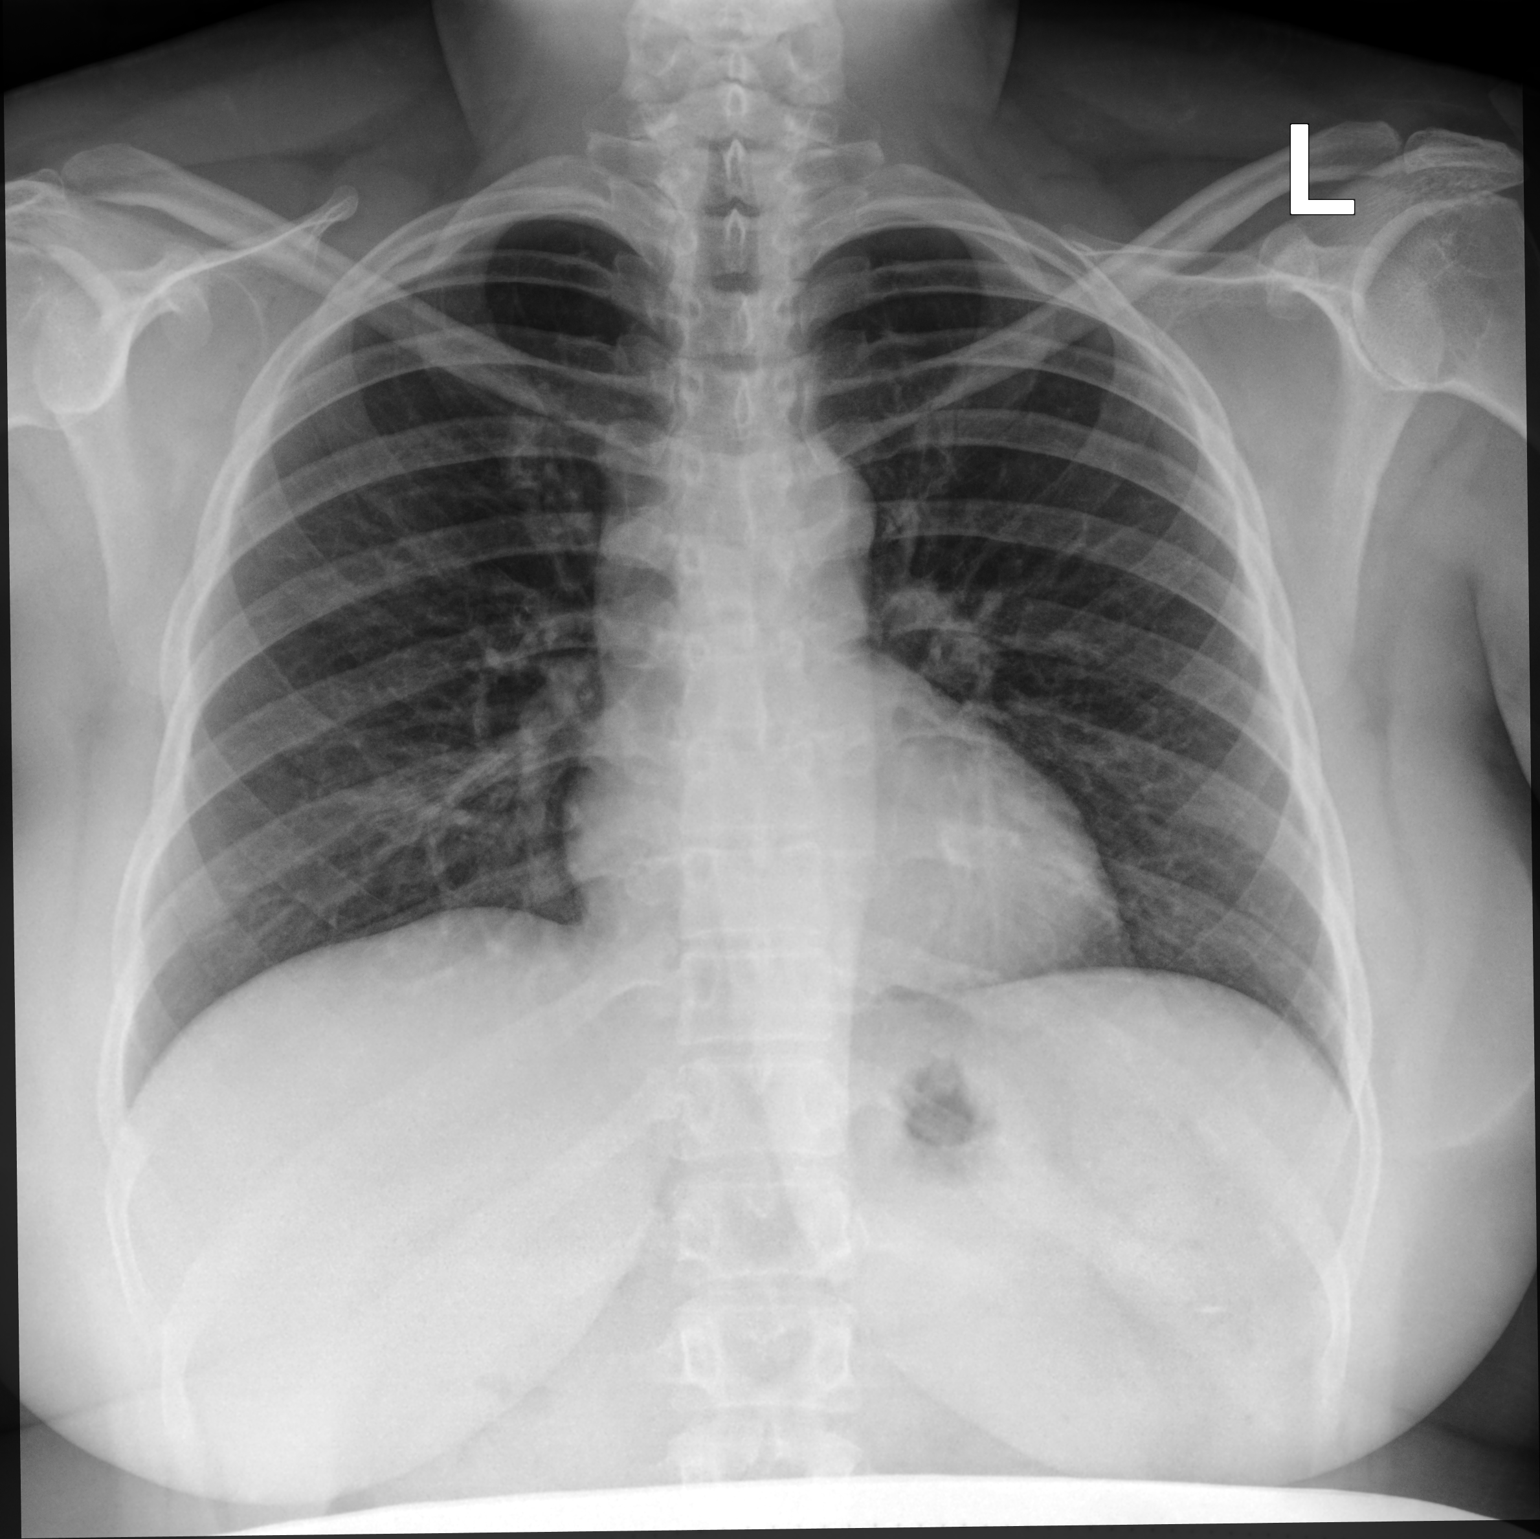

[chest lat]
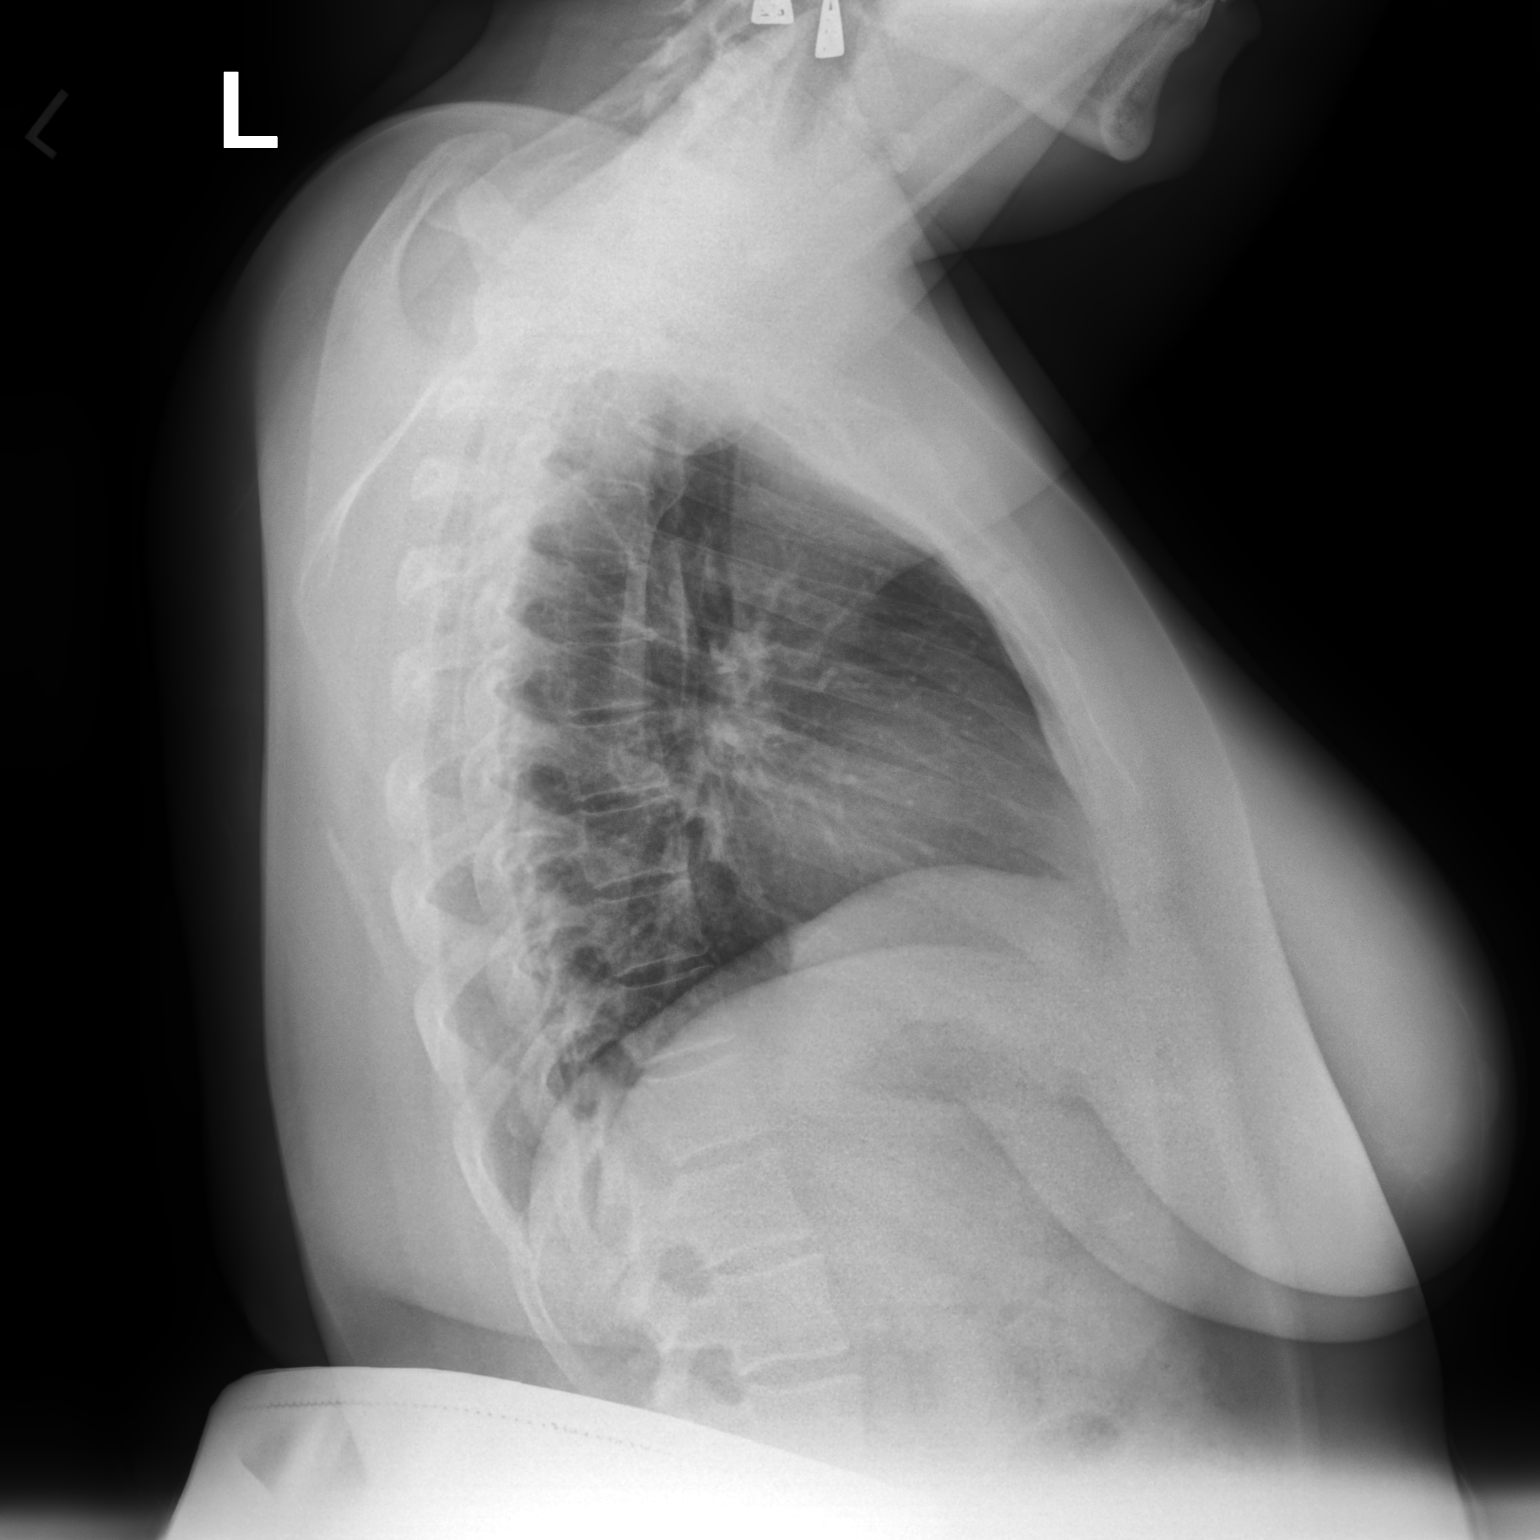

[2 of 2 positions shown; findings below may reference images not displayed]

FINDINGS: The lungs are adequately inflated. There is subtle increased density
in the right infrahilar region likely in the middle lobe. The left
lung is clear. There is no pleural effusion. The heart and pulmonary
vascularity are normal. The mediastinum is normal in width. There is
calcification in the wall of the aortic arch. The bony thorax
exhibits no acute abnormality.
IMPRESSION: Subsegmental atelectasis or developing pneumonia in the right middle
lobe. Followup PA and lateral chest X-ray is recommended in 3-4
weeks following trial of antibiotic therapy to ensure resolution and
exclude underlying malignancy.

## 2019-09-02 ENCOUNTER — Ambulatory Visit (INDEPENDENT_AMBULATORY_CARE_PROVIDER_SITE_OTHER): Payer: BC Managed Care – PPO

## 2019-09-02 DIAGNOSIS — J309 Allergic rhinitis, unspecified: Secondary | ICD-10-CM | POA: Diagnosis not present

## 2019-09-10 ENCOUNTER — Ambulatory Visit (INDEPENDENT_AMBULATORY_CARE_PROVIDER_SITE_OTHER): Payer: BC Managed Care – PPO

## 2019-09-10 DIAGNOSIS — J309 Allergic rhinitis, unspecified: Secondary | ICD-10-CM

## 2019-09-16 ENCOUNTER — Ambulatory Visit (INDEPENDENT_AMBULATORY_CARE_PROVIDER_SITE_OTHER): Payer: BC Managed Care – PPO

## 2019-09-16 DIAGNOSIS — J309 Allergic rhinitis, unspecified: Secondary | ICD-10-CM

## 2019-09-27 ENCOUNTER — Ambulatory Visit (INDEPENDENT_AMBULATORY_CARE_PROVIDER_SITE_OTHER): Payer: BC Managed Care – PPO

## 2019-09-27 DIAGNOSIS — J309 Allergic rhinitis, unspecified: Secondary | ICD-10-CM | POA: Diagnosis not present

## 2019-10-08 ENCOUNTER — Ambulatory Visit (INDEPENDENT_AMBULATORY_CARE_PROVIDER_SITE_OTHER): Payer: BC Managed Care – PPO

## 2019-10-08 DIAGNOSIS — J309 Allergic rhinitis, unspecified: Secondary | ICD-10-CM | POA: Diagnosis not present

## 2019-10-14 ENCOUNTER — Ambulatory Visit (INDEPENDENT_AMBULATORY_CARE_PROVIDER_SITE_OTHER): Payer: BC Managed Care – PPO

## 2019-10-14 DIAGNOSIS — J309 Allergic rhinitis, unspecified: Secondary | ICD-10-CM | POA: Diagnosis not present

## 2019-10-20 ENCOUNTER — Ambulatory Visit (INDEPENDENT_AMBULATORY_CARE_PROVIDER_SITE_OTHER): Payer: BC Managed Care – PPO

## 2019-10-20 DIAGNOSIS — J309 Allergic rhinitis, unspecified: Secondary | ICD-10-CM

## 2019-10-27 ENCOUNTER — Ambulatory Visit: Payer: BC Managed Care – PPO | Admitting: Allergy

## 2019-10-27 ENCOUNTER — Encounter: Payer: Self-pay | Admitting: Allergy

## 2019-10-27 ENCOUNTER — Other Ambulatory Visit: Payer: Self-pay

## 2019-10-27 ENCOUNTER — Ambulatory Visit: Payer: Self-pay

## 2019-10-27 VITALS — BP 126/80 | HR 80 | Ht 61.0 in

## 2019-10-27 DIAGNOSIS — J3089 Other allergic rhinitis: Secondary | ICD-10-CM

## 2019-10-27 DIAGNOSIS — J4530 Mild persistent asthma, uncomplicated: Secondary | ICD-10-CM | POA: Diagnosis not present

## 2019-10-27 DIAGNOSIS — H1013 Acute atopic conjunctivitis, bilateral: Secondary | ICD-10-CM | POA: Diagnosis not present

## 2019-10-27 DIAGNOSIS — T781XXD Other adverse food reactions, not elsewhere classified, subsequent encounter: Secondary | ICD-10-CM

## 2019-10-27 DIAGNOSIS — J309 Allergic rhinitis, unspecified: Secondary | ICD-10-CM

## 2019-10-27 NOTE — Patient Instructions (Signed)
Allergies  - continue avoidance measures for trees, weeds, grasses, molds, dust mites, cockroach  - continue dymista 1 spray each nostril twice a day. this is a combination nasal spray with Flonase + Astelin (nasal antihistamine).  This helps with both nasal congestion and drainage.   - try Xyzal 5mg  daily.  This is a long-acting antihistamine that can replace Allegra if effective.   - for itchy/watery/red eyes use Pazeo or Pataday 1 drop each eye daily as needed  - continue allergen immunotherapy per schedule  Reactive airway -increase to Symbicort 80 mcg 1puff twice a day.  Advised if symptoms worsen or she is not meeting the below goals to make Symbicort 80 2 puffs twice a day maintenance medication.  Also advised that she can use Symbicort as an as needed for rescue of symptoms similar to albuterol with the updated guidelines. - have access to albuterol inhaler 2 puffs every 4-6 hours as needed for cough/wheeze/shortness of breath/chest tightness.  May use 15-20 minutes prior to activity.   Monitor frequency of use.    Pollen food allergy syndrome  - skin testing at initial visit for select foods is positive to carrots and negative for apples and peaches  - The oral allergy syndrome (OAS) or pollen-food allergy syndrome (PFAS) is a relatively common form of food allergy, particularly in adults. It typically occurs in people who have pollen allergies when the immune system "sees" proteins on the food that look like proteins on the pollen. This results in the allergy antibody (IgE) binding to the food instead of the pollen. Patients typically report itching and/or mild swelling of the mouth and throat immediately following ingestion of certain uncooked fruits (including nuts) or raw vegetables. Only a very small number of affected individuals experience systemic allergic reactions, such as anaphylaxis which occurs with true food allergies.        Follow-up 4 months or sooner if needed

## 2019-10-27 NOTE — Progress Notes (Signed)
Follow-up Note  RE: Rebecca Simon. Agent MRN: [id number] DOB: 07-25-1975 Date of Office Visit: 10/27/2019   History of present illness: Rebecca Simon is a 44 y.o. female presenting today for follow-up of allergic rhinoconjunctivitis, reactive airway disease and pollen food allergy syndrome. She was last seen in the office on 06/25/2019 by Dr. Delorse Simon.   Reactive airway disease She states that overall her breathing is better since her last visit. However, she has been experiencing some shortness of breath, tickling in her throat, a little wheezing and cough that tends to come on around 4-5pm most days. These symptoms have prompted her to use her albuterol about twice weekly. These symptoms routinely go away after taking her two puffs of symbicort at night around 8pm. She attributes these symptoms to talking at work all day.  Allergic rhinoconjunctivitis Since her last visit, her allergy symptoms have been very well under control. She has not needed to use Pataday eye drops for itchy/watery eyes and she has also not needed to use Allegra. She is using her flonase and astelin nasal spray once daily in the morning which has controlled the limited nasal drainage that she has experienced. She is doing well on allergen immunotherapy without systemic rash local reactions.  Pollen food allergy syndrome Since her last visit, she has continued to avoid apples entirely. She does not eat whole carrots, but she will remove any carrots from pre-packaged salad without problem. She has intentionally exposed herself to peaches and developed a mildly itchy throat that goes away with drinking water.  Review of systems: Review of Systems  Constitutional: Negative for chills, fever and malaise/fatigue.  HENT:       See HPI  Eyes: Negative.   Respiratory:       See HPI  Cardiovascular: Negative.   Gastrointestinal: Negative.   Skin: Negative for itching and rash.    All other systems negative unless noted  above in HPI  Past medical/social/surgical/family history have been reviewed and are unchanged unless specifically indicated below.  No changes  Medication List: Current Outpatient Medications  Medication Sig Dispense Refill   Albuterol Sulfate (PROAIR RESPICLICK) 108 (90 Base) MCG/ACT AEPB Inhale 2 puffs into the lungs every 4 (four) hours as needed. 2 each 3   azelastine (ASTELIN) 0.1 % nasal spray Place 2 sprays into both nostrils 2 (two) times daily. Use in each nostril as directed 30 mL 12   Azelastine-Fluticasone 137-50 MCG/ACT SUSP azelastine-fluticasone 137 mcg-50 mcg/spray nasal spray  U 1 SPRAY NASALLY BID     budesonide-formoterol (SYMBICORT) 80-4.5 MCG/ACT inhaler Inhale 2 puffs into the lungs 2 (two) times daily. 1 Inhaler 3   fluticasone (FLONASE) 50 MCG/ACT nasal spray Place 2 sprays into both nostrils daily. 16 g 5   Multiple Vitamin (MULTIVITAMIN PO) Take by mouth.     Norethindrone-Ethinyl Estradiol-Fe Biphas (LO LOESTRIN FE) 1 MG-10 MCG / 10 MCG tablet Take 1 tablet by mouth every evening.     No current facility-administered medications for this visit.     Known medication allergies: Allergies  Allergen Reactions   Sulfa Antibiotics Anaphylaxis and Swelling   Vicodin [Hydrocodone-Acetaminophen] Other (See Comments)    Nightmares, visual disturbances     Physical examination: Blood pressure 126/80, pulse 80, height 5\' 1"  (1.549 m), SpO2 99 %.  General: Alert, interactive, in no acute distress. HEENT: TMs pearly gray, turbinates mildly edematous with clear discharge, post-pharynx non erythematous. Neck: Supple without lymphadenopathy. Lungs: Clear to auscultation without wheezing, rhonchi or  rales. {no increased work of breathing. CV: Normal S1, S2 without murmurs. Abdomen: Nondistended, nontender. Skin: Warm and dry, without lesions or rashes. Extremities:  No clubbing, cyanosis or edema. Neuro:   Grossly  intact.  Diagnositics/Labs:  Spirometry: FEV1: 2.23, FVC: 2.85, ratio consistent with normal ventilatory function  Assessment and plan:   Allergic rhinitis with conjunctivitis - continue avoidance measures for trees, weeds, grasses, molds, dust mites, cockroach  - continue dymista 1 spray each nostril twice a day. this is a combination nasal spray with Flonase + Astelin (nasal antihistamine).  This helps with both nasal congestion and drainage.   - try Xyzal 5mg  daily.  This is a long-acting antihistamine that can replace Allegra if effective.   - for itchy/watery/red eyes use Pazeo or Pataday 1 drop each eye daily as needed  - continue allergen immunotherapy per schedule  Reactive airway -increase to Symbicort 80 mcg 1puff twice a day.  Advised if symptoms worsen or she is not meeting the below goals to make Symbicort 80 2 puffs twice a day maintenance medication.  Also advised that she can use Symbicort as an as needed for rescue of symptoms similar to albuterol with the updated guidelines. - have access to albuterol inhaler 2 puffs every 4-6 hours as needed for cough/wheeze/shortness of breath/chest tightness.  May use 15-20 minutes prior to activity.   Monitor frequency of use.    Pollen food allergy syndrome  - skin testing at initial visit for select foods is positive to carrots and negative for apples and peaches  - The oral allergy syndrome (OAS) or pollen-food allergy syndrome (PFAS) is a relatively common form of food allergy, particularly in adults. It typically occurs in people who have pollen allergies when the immune system "sees" proteins on the food that look like proteins on the pollen. This results in the allergy antibody (IgE) binding to the food instead of the pollen. Patients typically report itching and/or mild swelling of the mouth and throat immediately following ingestion of certain uncooked fruits (including nuts) or raw vegetables. Only a very small number of affected  individuals experience systemic allergic reactions, such as anaphylaxis which occurs with true food allergies.     Follow-up 4 months or sooner if needed  I appreciate the opportunity to take part in Rebecca Simon's care. Please do not hesitate to contact me with questions.  Sincerely,  , MD PGY-1, Internal Medicine  Attestation: I performed a history and physical examination of the patient and discussed management with the resident. I reviewed the residents note and agree with the documented findings and plan of care. The note in its entirety was edited by myself, including the physical exam, assessment, and plan.   Jasmine December, MD [facility]

## 2019-11-04 ENCOUNTER — Ambulatory Visit (INDEPENDENT_AMBULATORY_CARE_PROVIDER_SITE_OTHER): Payer: BC Managed Care – PPO

## 2019-11-04 DIAGNOSIS — J309 Allergic rhinitis, unspecified: Secondary | ICD-10-CM | POA: Diagnosis not present

## 2019-11-11 ENCOUNTER — Ambulatory Visit (INDEPENDENT_AMBULATORY_CARE_PROVIDER_SITE_OTHER): Payer: BC Managed Care – PPO

## 2019-11-11 DIAGNOSIS — J309 Allergic rhinitis, unspecified: Secondary | ICD-10-CM | POA: Diagnosis not present

## 2019-11-19 ENCOUNTER — Ambulatory Visit (INDEPENDENT_AMBULATORY_CARE_PROVIDER_SITE_OTHER): Payer: BC Managed Care – PPO

## 2019-11-19 DIAGNOSIS — J309 Allergic rhinitis, unspecified: Secondary | ICD-10-CM | POA: Diagnosis not present

## 2019-11-25 ENCOUNTER — Ambulatory Visit (INDEPENDENT_AMBULATORY_CARE_PROVIDER_SITE_OTHER): Payer: BC Managed Care – PPO

## 2019-11-25 DIAGNOSIS — J309 Allergic rhinitis, unspecified: Secondary | ICD-10-CM

## 2019-12-07 ENCOUNTER — Ambulatory Visit (INDEPENDENT_AMBULATORY_CARE_PROVIDER_SITE_OTHER): Payer: BC Managed Care – PPO

## 2019-12-07 DIAGNOSIS — J309 Allergic rhinitis, unspecified: Secondary | ICD-10-CM | POA: Diagnosis not present

## 2019-12-14 ENCOUNTER — Ambulatory Visit (INDEPENDENT_AMBULATORY_CARE_PROVIDER_SITE_OTHER): Payer: BC Managed Care – PPO

## 2019-12-14 DIAGNOSIS — J309 Allergic rhinitis, unspecified: Secondary | ICD-10-CM | POA: Diagnosis not present

## 2019-12-23 ENCOUNTER — Ambulatory Visit (INDEPENDENT_AMBULATORY_CARE_PROVIDER_SITE_OTHER): Payer: 59

## 2019-12-23 DIAGNOSIS — J309 Allergic rhinitis, unspecified: Secondary | ICD-10-CM | POA: Diagnosis not present

## 2019-12-30 ENCOUNTER — Ambulatory Visit (INDEPENDENT_AMBULATORY_CARE_PROVIDER_SITE_OTHER): Payer: 59 | Admitting: *Deleted

## 2019-12-30 DIAGNOSIS — J309 Allergic rhinitis, unspecified: Secondary | ICD-10-CM | POA: Diagnosis not present

## 2020-01-06 ENCOUNTER — Ambulatory Visit (INDEPENDENT_AMBULATORY_CARE_PROVIDER_SITE_OTHER): Payer: 59

## 2020-01-06 DIAGNOSIS — J309 Allergic rhinitis, unspecified: Secondary | ICD-10-CM | POA: Diagnosis not present

## 2020-01-12 ENCOUNTER — Other Ambulatory Visit: Payer: Self-pay | Admitting: Family Medicine

## 2020-01-12 DIAGNOSIS — J4540 Moderate persistent asthma, uncomplicated: Secondary | ICD-10-CM

## 2020-01-13 ENCOUNTER — Ambulatory Visit (INDEPENDENT_AMBULATORY_CARE_PROVIDER_SITE_OTHER): Payer: 59 | Admitting: *Deleted

## 2020-01-13 DIAGNOSIS — J309 Allergic rhinitis, unspecified: Secondary | ICD-10-CM

## 2020-01-25 ENCOUNTER — Ambulatory Visit (INDEPENDENT_AMBULATORY_CARE_PROVIDER_SITE_OTHER): Payer: 59

## 2020-01-25 DIAGNOSIS — J309 Allergic rhinitis, unspecified: Secondary | ICD-10-CM

## 2020-02-03 ENCOUNTER — Ambulatory Visit (INDEPENDENT_AMBULATORY_CARE_PROVIDER_SITE_OTHER): Payer: 59

## 2020-02-03 DIAGNOSIS — J309 Allergic rhinitis, unspecified: Secondary | ICD-10-CM | POA: Diagnosis not present

## 2020-02-08 ENCOUNTER — Ambulatory Visit (INDEPENDENT_AMBULATORY_CARE_PROVIDER_SITE_OTHER): Payer: 59

## 2020-02-08 DIAGNOSIS — J309 Allergic rhinitis, unspecified: Secondary | ICD-10-CM

## 2020-02-17 ENCOUNTER — Ambulatory Visit (INDEPENDENT_AMBULATORY_CARE_PROVIDER_SITE_OTHER): Payer: 59

## 2020-02-17 DIAGNOSIS — J309 Allergic rhinitis, unspecified: Secondary | ICD-10-CM

## 2020-02-19 ENCOUNTER — Other Ambulatory Visit: Payer: Self-pay | Admitting: Family Medicine

## 2020-02-19 DIAGNOSIS — J4540 Moderate persistent asthma, uncomplicated: Secondary | ICD-10-CM

## 2020-02-24 ENCOUNTER — Ambulatory Visit (INDEPENDENT_AMBULATORY_CARE_PROVIDER_SITE_OTHER): Payer: 59

## 2020-02-24 DIAGNOSIS — J309 Allergic rhinitis, unspecified: Secondary | ICD-10-CM

## 2020-03-01 ENCOUNTER — Ambulatory Visit: Payer: 59 | Admitting: Allergy

## 2020-03-02 ENCOUNTER — Ambulatory Visit (INDEPENDENT_AMBULATORY_CARE_PROVIDER_SITE_OTHER): Payer: 59 | Admitting: *Deleted

## 2020-03-02 DIAGNOSIS — J309 Allergic rhinitis, unspecified: Secondary | ICD-10-CM | POA: Diagnosis not present

## 2020-03-08 ENCOUNTER — Ambulatory Visit (INDEPENDENT_AMBULATORY_CARE_PROVIDER_SITE_OTHER): Payer: 59

## 2020-03-08 DIAGNOSIS — J309 Allergic rhinitis, unspecified: Secondary | ICD-10-CM

## 2020-03-14 NOTE — Progress Notes (Signed)
VIALS EXP 03-15-21 

## 2020-03-16 DIAGNOSIS — J3089 Other allergic rhinitis: Secondary | ICD-10-CM

## 2020-03-17 ENCOUNTER — Ambulatory Visit (INDEPENDENT_AMBULATORY_CARE_PROVIDER_SITE_OTHER): Payer: 59 | Admitting: *Deleted

## 2020-03-17 DIAGNOSIS — J309 Allergic rhinitis, unspecified: Secondary | ICD-10-CM

## 2020-03-19 ENCOUNTER — Other Ambulatory Visit: Payer: Self-pay | Admitting: Family Medicine

## 2020-03-19 DIAGNOSIS — J4540 Moderate persistent asthma, uncomplicated: Secondary | ICD-10-CM

## 2020-03-30 ENCOUNTER — Ambulatory Visit (INDEPENDENT_AMBULATORY_CARE_PROVIDER_SITE_OTHER): Payer: 59

## 2020-03-30 DIAGNOSIS — J309 Allergic rhinitis, unspecified: Secondary | ICD-10-CM

## 2020-04-05 ENCOUNTER — Ambulatory Visit (INDEPENDENT_AMBULATORY_CARE_PROVIDER_SITE_OTHER): Payer: 59

## 2020-04-05 DIAGNOSIS — J309 Allergic rhinitis, unspecified: Secondary | ICD-10-CM | POA: Diagnosis not present

## 2020-04-12 ENCOUNTER — Ambulatory Visit (INDEPENDENT_AMBULATORY_CARE_PROVIDER_SITE_OTHER): Payer: 59

## 2020-04-12 DIAGNOSIS — J309 Allergic rhinitis, unspecified: Secondary | ICD-10-CM

## 2020-04-20 ENCOUNTER — Ambulatory Visit (INDEPENDENT_AMBULATORY_CARE_PROVIDER_SITE_OTHER): Payer: 59 | Admitting: Allergy

## 2020-04-20 ENCOUNTER — Encounter: Payer: Self-pay | Admitting: Allergy

## 2020-04-20 ENCOUNTER — Other Ambulatory Visit: Payer: Self-pay

## 2020-04-20 VITALS — BP 118/72 | HR 70 | Temp 97.2°F | Resp 18 | Ht 61.0 in | Wt 197.8 lb

## 2020-04-20 DIAGNOSIS — H1013 Acute atopic conjunctivitis, bilateral: Secondary | ICD-10-CM | POA: Diagnosis not present

## 2020-04-20 DIAGNOSIS — T781XXD Other adverse food reactions, not elsewhere classified, subsequent encounter: Secondary | ICD-10-CM

## 2020-04-20 DIAGNOSIS — J4540 Moderate persistent asthma, uncomplicated: Secondary | ICD-10-CM | POA: Diagnosis not present

## 2020-04-20 DIAGNOSIS — T7819XD Other adverse food reactions, not elsewhere classified, subsequent encounter: Secondary | ICD-10-CM

## 2020-04-20 DIAGNOSIS — J3089 Other allergic rhinitis: Secondary | ICD-10-CM | POA: Diagnosis not present

## 2020-04-20 DIAGNOSIS — J309 Allergic rhinitis, unspecified: Secondary | ICD-10-CM

## 2020-04-20 MED ORDER — BUDESONIDE-FORMOTEROL FUMARATE 80-4.5 MCG/ACT IN AERO: 2.0000 | INHALATION_SPRAY | Freq: Two times a day (BID) | RESPIRATORY_TRACT | 5 refills | Status: DC

## 2020-04-20 MED ORDER — AZELASTINE HCL 0.1 % NA SOLN
2.0000 | Freq: Two times a day (BID) | NASAL | 5 refills | Status: DC
Start: 2020-04-20 — End: 2020-09-21

## 2020-04-20 MED ORDER — FLUTICASONE PROPIONATE 50 MCG/ACT NA SUSP: 2.0000 | Freq: Every day | NASAL | 5 refills | Status: DC

## 2020-04-20 MED ORDER — PROAIR RESPICLICK 108 (90 BASE) MCG/ACT IN AEPB: 2.0000 | INHALATION_SPRAY | RESPIRATORY_TRACT | 1 refills | Status: DC | PRN

## 2020-04-20 NOTE — Progress Notes (Signed)
Follow-up Note  RE: Rebecca Simon. Leger MRN: [id number] DOB: 20-Nov-1975 Date of Office Visit: 04/20/2020   History of present illness: Rebecca Simon is a 45 y.o. female presenting today for follow-up of allergic rhinitis with pollen food allergy, reactive airway.  She was last seen in the office on 10/27/19 by myself.    She is doing well on immunotherapy without persistent large local or systemic symptoms.  She states about 2 weeks ago she did have a larger knot at her injection site however the next injection was a new valve that she had to receive a lower dose anyway and she has not had any issues since.  She does note some nasal drainage and states the flonase and astelin do help.  She is taking histabloc supplement at this time.  With her reactive airway she states she has been having some cough at night.  She states she may use her albuterol on average 1-2 times a week mostly at nighttime.  She has not having nighttime awakenings however.  She has not had any ED or urgent care visits or any systemic steroid needs.  She is taking her Symbicort 80 mcg 1 puff twice a day at this time.   Review of systems: Review of Systems  Constitutional: Negative.   HENT:       See HPI  Eyes: Negative.   Respiratory:       See HPI  Cardiovascular: Negative.   Gastrointestinal: Negative.   Musculoskeletal: Negative.   Skin: Negative.   Neurological: Negative.     All other systems negative unless noted above in HPI  Past medical/social/surgical/family history have been reviewed and are unchanged unless specifically indicated below.  No changes  Medication List: Current Outpatient Medications  Medication Sig Dispense Refill  . Albuterol Sulfate (PROAIR RESPICLICK) 108 (90 Base) MCG/ACT AEPB Inhale 2 puffs into the lungs every 4 (four) hours as needed. 2 each 3  . azelastine (ASTELIN) 0.1 % nasal spray Place 2 sprays into both nostrils 2 (two) times daily. Use in each nostril as directed 30  mL 12  . Azelastine-Fluticasone 137-50 MCG/ACT SUSP azelastine-fluticasone 137 mcg-50 mcg/spray nasal spray  U 1 SPRAY NASALLY BID    . budesonide-formoterol (SYMBICORT) 80-4.5 MCG/ACT inhaler INHALE 2 PUFFS INTO THE LUNGS TWICE DAILY 10.2 g 0  . fluticasone (FLONASE) 50 MCG/ACT nasal spray Place 2 sprays into both nostrils daily. 16 g 5  . Multiple Vitamin (MULTIVITAMIN PO) Take by mouth.    . Norethindrone-Ethinyl Estradiol-Fe Biphas (LO LOESTRIN FE) 1 MG-10 MCG / 10 MCG tablet Take 1 tablet by mouth every evening.     No current facility-administered medications for this visit.     Known medication allergies: Allergies  Allergen Reactions  . Sulfa Antibiotics Anaphylaxis and Swelling  . Vicodin [Hydrocodone-Acetaminophen] Other (See Comments)    Nightmares, visual disturbances     Physical examination: Blood pressure 118/72, pulse 70, temperature (!) 97.2 F (36.2 C), resp. rate 18, height 5\' 1"  (1.549 m), weight 197 lb 12.8 oz (89.7 kg), SpO2 95 %.  General: Alert, interactive, in no acute distress. HEENT: PERRLA, TMs pearly gray, turbinates non-edematous without discharge, post-pharynx non erythematous. Neck: Supple without lymphadenopathy. Lungs: Clear to auscultation without wheezing, rhonchi or rales. {no increased work of breathing. CV: Normal S1, S2 without murmurs. Abdomen: Nondistended, nontender. Skin: Warm and dry, without lesions or rashes. Extremities:  No clubbing, cyanosis or edema. Neuro:   Grossly intact.  Diagnositics/Labs: Allergen immunotherapy injections provided  today  Assessment and plan: Allergic rhinitis with conjunctivitis  - continue avoidance measures for trees, weeds, grasses, molds, dust mites, cockroach  - continue Astelin (nasal antihistamine) to help manage nasal drainage.    - if not having nasal congestion can hold Flonase and restart if congestion occurs.  Use Flonase for 1-2 weeks at a time before stopping once symptoms   - continue  your antihistamine supplement.  If becomes less effective can take Xyzal (long-acting antihistamine)  - for itchy/watery/red eyes use Pazeo or Pataday 1 drop each eye daily as needed  - continue allergen immunotherapy per schedule.  Progressing well!  Reactive airway -take Symbicort 80 mcg 1 puff in AM and 2 puffs in PM.  If symptoms worsen or she is not meeting the below goals to make Symbicort 80 2 puffs twice a day.  Can also use Symbicort as an as needed for rescue of symptoms similar to albuterol with the updated guidelines. - have access to albuterol inhaler 2 puffs every 4-6 hours as needed for cough/wheeze/shortness of breath/chest tightness.  May use 15-20 minutes prior to activity.   Monitor frequency of use.    Control goals:   Full participation in all desired activities (may need albuterol before activity)  Albuterol use two time or less a week on average (not counting use with activity)  Cough interfering with sleep two time or less a month  Oral steroids no more than once a year  No hospitalizations  Pollen food allergy syndrome  - skin testing at initial visit for select foods was positive to carrots and negative for apples and peaches  - The oral allergy syndrome (OAS) or pollen-food allergy syndrome (PFAS) is a relatively common form of food allergy, particularly in adults. It typically occurs in people who have pollen allergies when the immune system "sees" proteins on the food that look like proteins on the pollen. This results in the allergy antibody (IgE) binding to the food instead of the pollen. Patients typically report itching and/or mild swelling of the mouth and throat following ingestion of certain uncooked fruits (including nuts) or raw vegetables. Only a very small number of affected individuals experience systemic allergic reactions, such as anaphylaxis which occurs with true food allergies.        Follow-up 4-6 months or sooner if needed  I appreciate  the opportunity to take part in Rebecca Simon's care. Please do not hesitate to contact me with questions.  Sincerely,   Margo Aye, MD Allergy/Immunology [facility]

## 2020-04-20 NOTE — Patient Instructions (Addendum)
Allergies  - continue avoidance measures for trees, weeds, grasses, molds, dust mites, cockroach  - continue Astelin (nasal antihistamine) to help manage nasal drainage.    - if not having nasal congestion can hold Flonase and restart if congestion occurs.  Use Flonase for 1-2 weeks at a time before stopping once symptoms   - continue your antihistamine supplement.  If becomes less effective can take Xyzal (long-acting antihistamine)  - for itchy/watery/red eyes use Pazeo or Pataday 1 drop each eye daily as needed  - continue allergen immunotherapy per schedule.  Progressing well!  Reactive airway -take Symbicort 80 mcg 1 puff in AM and 2 puffs in PM.  If symptoms worsen or she is not meeting the below goals to make Symbicort 80 2 puffs twice a day.  Can also use Symbicort as an as needed for rescue of symptoms similar to albuterol with the updated guidelines. - have access to albuterol inhaler 2 puffs every 4-6 hours as needed for cough/wheeze/shortness of breath/chest tightness.  May use 15-20 minutes prior to activity.   Monitor frequency of use.    Control goals:   Full participation in all desired activities (may need albuterol before activity)  Albuterol use two time or less a week on average (not counting use with activity)  Cough interfering with sleep two time or less a month  Oral steroids no more than once a year  No hospitalizations  Pollen food allergy syndrome  - skin testing at initial visit for select foods was positive to carrots and negative for apples and peaches  - The oral allergy syndrome (OAS) or pollen-food allergy syndrome (PFAS) is a relatively common form of food allergy, particularly in adults. It typically occurs in people who have pollen allergies when the immune system "sees" proteins on the food that look like proteins on the pollen. This results in the allergy antibody (IgE) binding to the food instead of the pollen. Patients typically report itching and/or  mild swelling of the mouth and throat immediately following ingestion of certain uncooked fruits (including nuts) or raw vegetables. Only a very small number of affected individuals experience systemic allergic reactions, such as anaphylaxis which occurs with true food allergies.        Follow-up 4-6 months or sooner if needed

## 2020-04-21 ENCOUNTER — Other Ambulatory Visit: Payer: Self-pay | Admitting: *Deleted

## 2020-04-21 MED ORDER — VENTOLIN HFA 108 (90 BASE) MCG/ACT IN AERS: 2.0000 | INHALATION_SPRAY | Freq: Four times a day (QID) | RESPIRATORY_TRACT | 1 refills | Status: DC | PRN

## 2020-04-26 ENCOUNTER — Ambulatory Visit (INDEPENDENT_AMBULATORY_CARE_PROVIDER_SITE_OTHER): Payer: 59 | Admitting: *Deleted

## 2020-04-26 DIAGNOSIS — J309 Allergic rhinitis, unspecified: Secondary | ICD-10-CM | POA: Diagnosis not present

## 2020-05-11 ENCOUNTER — Ambulatory Visit (INDEPENDENT_AMBULATORY_CARE_PROVIDER_SITE_OTHER): Payer: 59 | Admitting: *Deleted

## 2020-05-11 DIAGNOSIS — J309 Allergic rhinitis, unspecified: Secondary | ICD-10-CM

## 2020-05-19 ENCOUNTER — Ambulatory Visit (INDEPENDENT_AMBULATORY_CARE_PROVIDER_SITE_OTHER): Payer: 59 | Admitting: *Deleted

## 2020-05-19 DIAGNOSIS — J309 Allergic rhinitis, unspecified: Secondary | ICD-10-CM

## 2020-05-26 ENCOUNTER — Other Ambulatory Visit: Payer: Self-pay

## 2020-05-26 ENCOUNTER — Ambulatory Visit (INDEPENDENT_AMBULATORY_CARE_PROVIDER_SITE_OTHER): Payer: 59 | Admitting: *Deleted

## 2020-05-26 DIAGNOSIS — J309 Allergic rhinitis, unspecified: Secondary | ICD-10-CM | POA: Diagnosis not present

## 2020-05-29 ENCOUNTER — Other Ambulatory Visit: Payer: Self-pay

## 2020-05-29 ENCOUNTER — Ambulatory Visit (INDEPENDENT_AMBULATORY_CARE_PROVIDER_SITE_OTHER): Payer: 59 | Admitting: Family Medicine

## 2020-05-29 ENCOUNTER — Encounter: Payer: Self-pay | Admitting: Family Medicine

## 2020-05-29 VITALS — BP 118/80 | HR 72 | Temp 98.1°F | Ht 61.81 in | Wt 196.7 lb

## 2020-05-29 DIAGNOSIS — Z0000 Encounter for general adult medical examination without abnormal findings: Secondary | ICD-10-CM

## 2020-05-29 DIAGNOSIS — Z131 Encounter for screening for diabetes mellitus: Secondary | ICD-10-CM

## 2020-05-29 DIAGNOSIS — J302 Other seasonal allergic rhinitis: Secondary | ICD-10-CM

## 2020-05-29 DIAGNOSIS — Z13220 Encounter for screening for lipoid disorders: Secondary | ICD-10-CM

## 2020-05-29 DIAGNOSIS — Z8349 Family history of other endocrine, nutritional and metabolic diseases: Secondary | ICD-10-CM

## 2020-05-29 DIAGNOSIS — D72829 Elevated white blood cell count, unspecified: Secondary | ICD-10-CM

## 2020-05-29 DIAGNOSIS — Z8371 Family history of colonic polyps: Secondary | ICD-10-CM | POA: Diagnosis not present

## 2020-05-29 DIAGNOSIS — J45991 Cough variant asthma: Secondary | ICD-10-CM

## 2020-05-29 DIAGNOSIS — Z83719 Family history of colon polyps, unspecified: Secondary | ICD-10-CM

## 2020-05-29 DIAGNOSIS — R768 Other specified abnormal immunological findings in serum: Secondary | ICD-10-CM

## 2020-05-29 LAB — CBC WITH DIFFERENTIAL/PLATELET
Basophils Absolute: 0.1 10*3/uL (ref 0.0–0.1)
Basophils Relative: 0.4 % (ref 0.0–3.0)
Eosinophils Absolute: 0.3 10*3/uL (ref 0.0–0.7)
Eosinophils Relative: 2.6 % (ref 0.0–5.0)
HCT: 38.3 % (ref 36.0–46.0)
Hemoglobin: 12.6 g/dL (ref 12.0–15.0)
Lymphocytes Relative: 27.4 % (ref 12.0–46.0)
Lymphs Abs: 3.3 10*3/uL (ref 0.7–4.0)
MCHC: 32.9 g/dL (ref 30.0–36.0)
MCV: 91.2 fl (ref 78.0–100.0)
Monocytes Absolute: 0.5 10*3/uL (ref 0.1–1.0)
Monocytes Relative: 4.5 % (ref 3.0–12.0)
Neutro Abs: 8 10*3/uL — ABNORMAL HIGH (ref 1.4–7.7)
Neutrophils Relative %: 65.1 % (ref 43.0–77.0)
Platelets: 316 10*3/uL (ref 150.0–400.0)
RBC: 4.2 Mil/uL (ref 3.87–5.11)
RDW: 12.7 % (ref 11.5–15.5)
WBC: 12.2 10*3/uL — ABNORMAL HIGH (ref 4.0–10.5)

## 2020-05-29 LAB — COMPREHENSIVE METABOLIC PANEL
ALT: 17 U/L (ref 0–35)
AST: 16 U/L (ref 0–37)
Albumin: 4 g/dL (ref 3.5–5.2)
Alkaline Phosphatase: 90 U/L (ref 39–117)
BUN: 11 mg/dL (ref 6–23)
CO2: 24 mEq/L (ref 19–32)
Calcium: 9.4 mg/dL (ref 8.4–10.5)
Chloride: 104 mEq/L (ref 96–112)
Creatinine, Ser: 1.06 mg/dL (ref 0.40–1.20)
GFR: 63.65 mL/min (ref >60.00)
Glucose, Bld: 84 mg/dL (ref 70–99)
Potassium: 4.4 mEq/L (ref 3.5–5.1)
Sodium: 138 mEq/L (ref 135–145)
Total Bilirubin: 0.4 mg/dL (ref 0.2–1.2)
Total Protein: 7.5 g/dL (ref 6.0–8.3)

## 2020-05-29 LAB — LIPID PANEL
Cholesterol: 210 mg/dL — ABNORMAL HIGH (ref 0–200)
HDL: 52 mg/dL (ref >39.00)
LDL Cholesterol: 142 mg/dL — ABNORMAL HIGH (ref 0–99)
NonHDL: 158.33
Total CHOL/HDL Ratio: 4
Triglycerides: 84 mg/dL (ref 0.0–149.0)
VLDL: 16.8 mg/dL (ref 0.0–40.0)

## 2020-05-29 LAB — TSH: TSH: 0.82 u[IU]/mL (ref 0.35–4.50)

## 2020-05-29 LAB — T4, FREE: Free T4: 1.1 ng/dL (ref 0.60–1.60)

## 2020-05-29 LAB — HEMOGLOBIN A1C: Hgb A1c MFr Bld: 5.5 % (ref 4.6–6.5)
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
## 2020-05-30 LAB — THYROID ANTIBODIES
Thyroglobulin Ab: <1 IU/mL (ref <=1)
Thyroperoxidase Ab SerPl-aCnc: 10 IU/mL — ABNORMAL HIGH (ref <9)

## 2020-05-30 LAB — HEPATITIS C ANTIBODY
Hepatitis C Ab: NONREACTIVE
SIGNAL TO CUT-OFF: 0.04 (ref <1.00)

## 2020-06-01 ENCOUNTER — Ambulatory Visit (INDEPENDENT_AMBULATORY_CARE_PROVIDER_SITE_OTHER): Payer: 59 | Admitting: *Deleted

## 2020-06-01 DIAGNOSIS — J309 Allergic rhinitis, unspecified: Secondary | ICD-10-CM | POA: Diagnosis not present

## 2020-06-08 NOTE — Addendum Note (Signed)
Addended by: Johnella Moloney on: 06/08/2020 02:47 PM   Modules accepted: Orders

## 2020-06-13 ENCOUNTER — Encounter: Payer: Self-pay | Admitting: Internal Medicine

## 2020-06-13 ENCOUNTER — Ambulatory Visit (INDEPENDENT_AMBULATORY_CARE_PROVIDER_SITE_OTHER): Payer: 59

## 2020-06-13 DIAGNOSIS — J309 Allergic rhinitis, unspecified: Secondary | ICD-10-CM | POA: Diagnosis not present

## 2020-06-29 ENCOUNTER — Ambulatory Visit (INDEPENDENT_AMBULATORY_CARE_PROVIDER_SITE_OTHER): Payer: 59 | Admitting: *Deleted

## 2020-06-29 DIAGNOSIS — J309 Allergic rhinitis, unspecified: Secondary | ICD-10-CM | POA: Diagnosis not present

## 2020-07-04 ENCOUNTER — Ambulatory Visit (INDEPENDENT_AMBULATORY_CARE_PROVIDER_SITE_OTHER): Payer: 59 | Admitting: *Deleted

## 2020-07-04 DIAGNOSIS — J309 Allergic rhinitis, unspecified: Secondary | ICD-10-CM | POA: Diagnosis not present

## 2020-07-11 ENCOUNTER — Ambulatory Visit (INDEPENDENT_AMBULATORY_CARE_PROVIDER_SITE_OTHER): Payer: 59 | Admitting: *Deleted

## 2020-07-11 DIAGNOSIS — J309 Allergic rhinitis, unspecified: Secondary | ICD-10-CM | POA: Diagnosis not present

## 2020-07-17 DIAGNOSIS — J3081 Allergic rhinitis due to animal (cat) (dog) hair and dander: Secondary | ICD-10-CM | POA: Diagnosis not present

## 2020-07-17 NOTE — Progress Notes (Signed)
VIALS EXP 07-17-21 

## 2020-07-18 ENCOUNTER — Ambulatory Visit (INDEPENDENT_AMBULATORY_CARE_PROVIDER_SITE_OTHER): Payer: 59 | Admitting: *Deleted

## 2020-07-18 DIAGNOSIS — J309 Allergic rhinitis, unspecified: Secondary | ICD-10-CM

## 2020-07-27 ENCOUNTER — Ambulatory Visit (INDEPENDENT_AMBULATORY_CARE_PROVIDER_SITE_OTHER): Payer: 59 | Admitting: *Deleted

## 2020-07-27 DIAGNOSIS — J309 Allergic rhinitis, unspecified: Secondary | ICD-10-CM

## 2020-08-04 ENCOUNTER — Ambulatory Visit (INDEPENDENT_AMBULATORY_CARE_PROVIDER_SITE_OTHER): Payer: 59

## 2020-08-04 DIAGNOSIS — J309 Allergic rhinitis, unspecified: Secondary | ICD-10-CM | POA: Diagnosis not present

## 2020-08-08 ENCOUNTER — Ambulatory Visit (INDEPENDENT_AMBULATORY_CARE_PROVIDER_SITE_OTHER): Payer: 59 | Admitting: *Deleted

## 2020-08-08 DIAGNOSIS — J309 Allergic rhinitis, unspecified: Secondary | ICD-10-CM | POA: Diagnosis not present

## 2020-08-15 ENCOUNTER — Ambulatory Visit (INDEPENDENT_AMBULATORY_CARE_PROVIDER_SITE_OTHER): Payer: 59 | Admitting: *Deleted

## 2020-08-15 DIAGNOSIS — J309 Allergic rhinitis, unspecified: Secondary | ICD-10-CM | POA: Diagnosis not present

## 2020-08-17 ENCOUNTER — Other Ambulatory Visit: Payer: Self-pay

## 2020-08-17 ENCOUNTER — Ambulatory Visit (AMBULATORY_SURGERY_CENTER): Payer: Self-pay

## 2020-08-17 VITALS — Ht 61.0 in | Wt 198.0 lb

## 2020-08-17 DIAGNOSIS — Z1211 Encounter for screening for malignant neoplasm of colon: Secondary | ICD-10-CM

## 2020-08-17 MED ORDER — NA SULFATE-K SULFATE-MG SULF 17.5-3.13-1.6 GM/177ML PO SOLN: 1.0000 | Freq: Once | ORAL | 0 refills | Status: AC

## 2020-08-17 NOTE — Progress Notes (Signed)
No allergies to soy or egg Pt is not on blood thinners or diet pills Denies issues with sedation/intubation Denies atrial flutter/fib Denies constipation   Emmi instructions given to pt  Pt is aware of Covid safety and care partner requirements.  

## 2020-08-22 ENCOUNTER — Ambulatory Visit (INDEPENDENT_AMBULATORY_CARE_PROVIDER_SITE_OTHER): Payer: 59

## 2020-08-22 DIAGNOSIS — J309 Allergic rhinitis, unspecified: Secondary | ICD-10-CM | POA: Diagnosis not present

## 2020-08-29 ENCOUNTER — Ambulatory Visit (INDEPENDENT_AMBULATORY_CARE_PROVIDER_SITE_OTHER): Payer: 59 | Admitting: *Deleted

## 2020-08-29 DIAGNOSIS — J309 Allergic rhinitis, unspecified: Secondary | ICD-10-CM | POA: Diagnosis not present

## 2020-08-31 ENCOUNTER — Encounter: Payer: Self-pay | Admitting: Internal Medicine

## 2020-08-31 ENCOUNTER — Ambulatory Visit (AMBULATORY_SURGERY_CENTER): Payer: 59 | Admitting: Internal Medicine

## 2020-08-31 ENCOUNTER — Other Ambulatory Visit: Payer: Self-pay

## 2020-08-31 VITALS — BP 114/68 | HR 70 | Temp 96.6°F | Resp 19 | Ht 61.0 in | Wt 198.0 lb

## 2020-08-31 DIAGNOSIS — Z1211 Encounter for screening for malignant neoplasm of colon: Secondary | ICD-10-CM

## 2020-08-31 MED ORDER — SODIUM CHLORIDE 0.9 % IV SOLN: 500.0000 mL | INTRAVENOUS | Status: DC

## 2020-08-31 NOTE — Patient Instructions (Signed)
Handout given for diverticulosis.  Next colonoscopy in 10 years for screening.  YOU HAD AN ENDOSCOPIC PROCEDURE TODAY AT THE [facility]:   Refer to the procedure report that was given to you for any specific questions about what was found during the examination.  If the procedure report does not answer your questions, please call your gastroenterologist to clarify.  If you requested that your care partner not be given the details of your procedure findings, then the procedure report has been included in a sealed envelope for you to review at your convenience later.  YOU SHOULD EXPECT: Some feelings of bloating in the abdomen. Passage of more gas than usual.  Walking can help get rid of the air that was put into your GI tract during the procedure and reduce the bloating. If you had a lower endoscopy (such as a colonoscopy or flexible sigmoidoscopy) you may notice spotting of blood in your stool or on the toilet paper. If you underwent a bowel prep for your procedure, you may not have a normal bowel movement for a few days.  Please Note:  You might notice some irritation and congestion in your nose or some drainage.  This is from the oxygen used during your procedure.  There is no need for concern and it should clear up in a day or so.  SYMPTOMS TO REPORT IMMEDIATELY:   Following lower endoscopy (colonoscopy or flexible sigmoidoscopy):  Excessive amounts of blood in the stool  Significant tenderness or worsening of abdominal pains  Swelling of the abdomen that is new, acute  Fever of 100F or higher  For urgent or emergent issues, a gastroenterologist can be reached at any hour by calling [phone]. Do not use MyChart messaging for urgent concerns.    DIET:  We do recommend a small meal at first, but then you may proceed to your regular diet.  Drink plenty of fluids but you should avoid alcoholic beverages for 24 hours.  ACTIVITY:  You should plan to take it easy for the  rest of today and you should NOT DRIVE or use heavy machinery until tomorrow (because of the sedation medicines used during the test).    FOLLOW UP: Our staff will call the number listed on your records 48-72 hours following your procedure to check on you and address any questions or concerns that you may have regarding the information given to you following your procedure. If we do not reach you, we will leave a message.  We will attempt to reach you two times.  During this call, we will ask if you have developed any symptoms of COVID 19. If you develop any symptoms (ie: fever, flu-like symptoms, shortness of breath, cough etc.) before then, please call [phone].  If you test positive for Covid 19 in the 2 weeks post procedure, please call and report this information to [place].    If any biopsies were taken you will be contacted by phone or by letter within the next 1-3 weeks.  Please call us at [phone] if you have not heard about the biopsies in 3 weeks.    SIGNATURES/CONFIDENTIALITY: You and/or your care partner have signed paperwork which will be entered into your electronic medical record.  These signatures attest to the fact that that the information above on your After Visit Summary has been reviewed and is understood.  Full responsibility of the confidentiality of this discharge information lies with you and/or your care-partner.

## 2020-08-31 NOTE — Progress Notes (Signed)
Pt's states no medical or surgical changes since previsit or office visit. 

## 2020-08-31 NOTE — Progress Notes (Signed)
To PACU, VSS. Report to [email or web address] 

## 2020-08-31 NOTE — Op Note (Signed)
[facility] Patient Name: Rebecca Simon Procedure Date: 08/31/2020 10:18 AM MRN: [id number] Endoscopist: Jerene Bears , MD Age: 45 Referring MD:  Date of Birth: [birth year] Gender: Female Account #: [id number] Procedure:                Colonoscopy Indications:              Screening for colorectal malignant neoplasm, This                            is the patient's first colonoscopy Medicines:                MAC Procedure:                Pre-Anesthesia Assessment:                           - Prior to the procedure, a History and Physical                            was performed, and patient medications and                            allergies were reviewed. The patient's tolerance of                            previous anesthesia was also reviewed. The risks                            and benefits of the procedure and the sedation                            options and risks were discussed with the patient.                            All questions were answered, and informed consent                            was obtained. Prior Anticoagulants: The patient has                            taken no previous anticoagulant or antiplatelet                            agents. ASA Grade Assessment: II - A patient with                            mild systemic disease. After reviewing the risks                            and benefits, the patient was deemed in                            satisfactory condition to undergo the procedure.  After obtaining informed consent, the colonoscope                            was passed under direct vision. Throughout the                            procedure, the patient's blood pressure, pulse, and                            oxygen saturations were monitored continuously. The                            Olympus PCF-[id number] (#[id number]) Colonoscope was                            introduced through the anus and advanced to the                             cecum, identified by appendiceal orifice and                            ileocecal valve. The colonoscopy was performed                            without difficulty. The patient tolerated the                            procedure well. The quality of the bowel                            preparation was excellent. The ileocecal valve,                            appendiceal orifice, and rectum were photographed. Scope In: 10:33:02 AM Scope Out: 10:41:52 AM Scope Withdrawal Time: 0 hours 6 minutes 36 seconds  Total Procedure Duration: 0 hours 8 minutes 50 seconds  Findings:                 The digital rectal exam was normal.                           There was a medium-sized lipoma, in the transverse                            colon.                           Multiple small-mouthed diverticula were found in                            the sigmoid colon.                           The exam was otherwise without abnormality on  direct and retroflexion views. Complications:            No immediate complications. Estimated Blood Loss:     Estimated blood loss: none. 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                           - Diverticulosis in the sigmoid colon.                           - The examination was otherwise normal on direct                            and retroflexion views.                           - No specimens collected. Recommendation:           - Patient has a contact number available for                            emergencies. The signs and symptoms of potential                            delayed complications were discussed with the                            patient. Return to normal activities tomorrow.                            Written discharge instructions were provided to the                            patient.                           - Resume previous diet.                            - Continue present medications.                           - Repeat colonoscopy in 10 years for screening                            purposes. Jerene Bears, MD 08/31/2020 10:44:34 AM This report has been signed electronically.

## 2020-09-04 ENCOUNTER — Telehealth: Payer: Self-pay | Admitting: *Deleted

## 2020-09-04 ENCOUNTER — Telehealth: Payer: Self-pay

## 2020-09-04 NOTE — Telephone Encounter (Signed)
  Follow up Call-  Call back number 08/31/2020  Post procedure Call Back phone  # [phone]  Permission to leave phone message Yes  Some recent data might be hidden     Patient questions:  Do you have a fever, pain , or abdominal swelling? No. Pain Score  0 *  Have you tolerated food without any problems? Yes.    Have you been able to return to your normal activities? Yes.    Do you have any questions about your discharge instructions: Diet   No. Medications  No. Follow up visit  No.  Do you have questions or concerns about your Care? No.  Actions: * If pain score is 4 or above: No action needed, pain <4.  1. Have you developed a fever since your procedure? no  2.   Have you had an respiratory symptoms (SOB or cough) since your procedure? no  3.   Have you tested positive for COVID 19 since your procedure no  4.   Have you had any family members/close contacts diagnosed with the COVID 19 since your procedure?  no   If yes to any of these questions please route to Laverna Peace, RN and Karlton Lemon, RN

## 2020-09-04 NOTE — Telephone Encounter (Signed)
Attempted f/u phone call. No answer. Left message. °

## 2020-09-15 ENCOUNTER — Ambulatory Visit (INDEPENDENT_AMBULATORY_CARE_PROVIDER_SITE_OTHER): Payer: 59

## 2020-09-15 DIAGNOSIS — J309 Allergic rhinitis, unspecified: Secondary | ICD-10-CM

## 2020-09-21 ENCOUNTER — Encounter: Payer: Self-pay | Admitting: Allergy

## 2020-09-21 ENCOUNTER — Other Ambulatory Visit: Payer: Self-pay

## 2020-09-21 ENCOUNTER — Ambulatory Visit (INDEPENDENT_AMBULATORY_CARE_PROVIDER_SITE_OTHER): Payer: 59 | Admitting: Allergy

## 2020-09-21 VITALS — BP 132/88 | HR 105 | Temp 98.0°F | Resp 14 | Ht 61.0 in | Wt 195.0 lb

## 2020-09-21 DIAGNOSIS — J4540 Moderate persistent asthma, uncomplicated: Secondary | ICD-10-CM | POA: Diagnosis not present

## 2020-09-21 DIAGNOSIS — T781XXD Other adverse food reactions, not elsewhere classified, subsequent encounter: Secondary | ICD-10-CM

## 2020-09-21 DIAGNOSIS — J3089 Other allergic rhinitis: Secondary | ICD-10-CM

## 2020-09-21 DIAGNOSIS — H1013 Acute atopic conjunctivitis, bilateral: Secondary | ICD-10-CM | POA: Diagnosis not present

## 2020-09-21 MED ORDER — BUDESONIDE-FORMOTEROL FUMARATE 160-4.5 MCG/ACT IN AERO: 2.0000 | INHALATION_SPRAY | Freq: Two times a day (BID) | RESPIRATORY_TRACT | 5 refills | Status: DC

## 2020-09-21 MED ORDER — AZELASTINE HCL 0.1 % NA SOLN: 2.0000 | Freq: Two times a day (BID) | NASAL | 5 refills | Status: DC

## 2020-09-21 NOTE — Progress Notes (Signed)
Follow-up Note  RE: Rebecca Simon. Rising MRN: [id number] DOB: 16-Sep-1975 Date of Office Visit: 09/21/2020   History of present illness: Rebecca Simon is a 45 y.o. 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  She was last seen in the office on 04/20/2020 by myself.  She states she still has cough and chest tightness mostly at night however this has improved since increasing the Symbicort 80 mcg to 1 puff in the morning and 2 puffs at night.  She states she is only using her albuterol now about once a week.  Which is a decrease from before.  Fortunately she has not needed to seek any urgent care or ED visits or require any systemic steroid needs. She does states she still has some nasal drainage but this has improved as well.  She does use the Astelin and Flonase 2 sprays each nostril in the mornings.  She does not a refill of the Astelin at this time.  She is taking her homeopathic natural antihistamine supplement and states it still provides mild relief.  She has not required use of her eyedrop.  She still avoids apples and peaches due to pollen food allergy syndrome but states she does eat carrots.     Review of systems: Review of Systems  Constitutional: Negative.   HENT:         See HPI  Eyes: Negative.   Respiratory:         See HPI  Cardiovascular: Negative.   Gastrointestinal: Negative.   Musculoskeletal: Negative.   Skin: Negative.   Neurological: Negative.    All other systems negative unless noted above in HPI  Past medical/social/surgical/family history have been reviewed and are unchanged unless specifically indicated below.  No changes  Medication List: Current Outpatient Medications  Medication Sig Dispense Refill   Albuterol Sulfate (PROAIR RESPICLICK) 108 (90 Base) MCG/ACT AEPB Inhale 2 puffs into the lungs every 4 (four) hours as needed. 2 each 1   azelastine (ASTELIN) 0.1  % nasal spray Place 2 sprays into both nostrils 2 (two) times daily. Use in each nostril as directed 30 mL 5   budesonide-formoterol (SYMBICORT) 80-4.5 MCG/ACT inhaler Inhale 2 puffs into the lungs 2 (two) times daily. 10.2 g 5   fluticasone (FLONASE) 50 MCG/ACT nasal spray Place 2 sprays into both nostrils daily. 16 g 5   Multiple Vitamin (MULTIVITAMIN PO) Take by mouth.     Norethindrone-Ethinyl Estradiol-Fe Biphas (LO LOESTRIN FE) 1 MG-10 MCG / 10 MCG tablet Take 1 tablet by mouth every evening.     No current facility-administered medications for this visit.     Known medication allergies: Allergies  Allergen Reactions   Sulfa Antibiotics Anaphylaxis and Swelling   Vicodin [Hydrocodone-Acetaminophen] Other (See Comments)    Nightmares, visual disturbances     Physical examination: Blood pressure 132/88, pulse (!) 105, temperature 98 F (36.7 C), temperature source Temporal, resp. rate 14, height 5\' 1"  (1.549 m), weight 195 lb (88.5 kg), SpO2 97 %.  General: Alert, interactive, in no acute distress. HEENT: PERRLA, TMs pearly gray, turbinates non-edematous without discharge, post-pharynx non erythematous. Neck: Supple without lymphadenopathy. Lungs: Clear to auscultation without wheezing, rhonchi or rales. {no increased work of breathing. CV: Normal S1, S2 without murmurs. Abdomen: Nondistended, nontender. Skin: Warm and dry, without lesions or rashes. Extremities:  No clubbing, cyanosis or edema. Neuro:   Grossly intact.  Diagnositics/Labs:  Spirometry: FEV1: 2.4 L 110%,  FVC: 2.89 L 108%, ratio consistent with mild obstructive pattern  Assessment and plan:   Allergic rhinitis with conjunctivitis  - continue avoidance measures for trees, weeds, grasses, molds, dust mites, cockroach  - continue Astelin (nasal antihistamine) to help manage nasal drainage.   Can use 2 sprays each nostril up to twice a day for drainage control  - if not having nasal congestion can hold Flonase  and restart if congestion occurs.  Use Flonase for 1-2 weeks at a time before stopping once symptoms   - continue your antihistamine supplement.  If becomes less effective can take Xyzal (long-acting antihistamine)  - for itchy/watery/red eyes use Pazeo or Pataday 1 drop each eye daily as needed  - continue allergen immunotherapy per schedule.  At maintenance dosing!  Reactive airway -increase to Symbicort 1 puff in AM and 2 puffs in PM.  If symptoms worsen or not meeting the below goals then take Symbicort 2 puffs twice a day.  Can also use Symbicort as an as needed for rescue of symptoms similar to albuterol with the updated guidelines. - have access to albuterol inhaler 2 puffs every 4-6 hours as needed for cough/wheeze/shortness of breath/chest tightness.  May use 15-20 minutes prior to activity.   Monitor frequency of use.    Control goals:  Full participation in all desired activities (may need albuterol before activity) Albuterol use two time or less a week on average (not counting use with activity) Cough interfering with sleep two time or less a month Oral steroids no more than once a year No hospitalizations  Pollen food allergy syndrome  - skin testing at initial visit for select foods was positive to carrots and negative for apples and peaches  - The oral allergy syndrome (OAS) or pollen-food allergy syndrome (PFAS) is a relatively common form of food allergy, particularly in adults. It typically occurs in people who have pollen allergies when the immune system "sees" proteins on the food that look like proteins on the pollen. This results in the allergy antibody (IgE) binding to the food instead of the pollen. Patients typically report itching and/or mild swelling of the mouth and throat immediately following ingestion of certain uncooked fruits (including nuts) or raw vegetables. Only a very small number of affected individuals experience systemic allergic reactions, such as  anaphylaxis which occurs with true food allergies.    Follow-up 4-6 months or sooner if needed  I appreciate the opportunity to take part in Rebecca Simon's care. Please do not hesitate to contact me with questions.  Sincerely,   Margo Aye, MD Allergy/Immunology [facility]

## 2020-09-21 NOTE — Patient Instructions (Addendum)
Allergies  - continue avoidance measures for trees, weeds, grasses, molds, dust mites, cockroach  - continue Astelin (nasal antihistamine) to help manage nasal drainage.   Can use 2 sprays each nostril up to twice a day for drainage control  - if not having nasal congestion can hold Flonase and restart if congestion occurs.  Use Flonase for 1-2 weeks at a time before stopping once symptoms   - continue your antihistamine supplement.  If becomes less effective can take Xyzal (long-acting antihistamine)  - for itchy/watery/red eyes use Pazeo or Pataday 1 drop each eye daily as needed  - continue allergen immunotherapy per schedule.  At maintenance dosing!  Reactive airway -increase to Symbicort 1 puff in AM and 2 puffs in PM.  If symptoms worsen or not meeting the below goals then take Symbicort 2 puffs twice a day.  Can also use Symbicort as an as needed for rescue of symptoms similar to albuterol with the updated guidelines. - have access to albuterol inhaler 2 puffs every 4-6 hours as needed for cough/wheeze/shortness of breath/chest tightness.  May use 15-20 minutes prior to activity.   Monitor frequency of use.    Control goals:  Full participation in all desired activities (may need albuterol before activity) Albuterol use two time or less a week on average (not counting use with activity) Cough interfering with sleep two time or less a month Oral steroids no more than once a year No hospitalizations  Pollen food allergy syndrome  - skin testing at initial visit for select foods was positive to carrots and negative for apples and peaches  - The oral allergy syndrome (OAS) or pollen-food allergy syndrome (PFAS) is a relatively common form of food allergy, particularly in adults. It typically occurs in people who have pollen allergies when the immune system "sees" proteins on the food that look like proteins on the pollen. This results in the allergy antibody (IgE) binding to the food  instead of the pollen. Patients typically report itching and/or mild swelling of the mouth and throat immediately following ingestion of certain uncooked fruits (including nuts) or raw vegetables. Only a very small number of affected individuals experience systemic allergic reactions, such as anaphylaxis which occurs with true food allergies.        Follow-up 4-6 months or sooner if needed

## 2020-09-26 ENCOUNTER — Ambulatory Visit (INDEPENDENT_AMBULATORY_CARE_PROVIDER_SITE_OTHER): Payer: 59 | Admitting: *Deleted

## 2020-09-26 DIAGNOSIS — J309 Allergic rhinitis, unspecified: Secondary | ICD-10-CM | POA: Diagnosis not present

## 2020-10-08 ENCOUNTER — Encounter (HOSPITAL_COMMUNITY): Payer: Self-pay

## 2020-10-08 ENCOUNTER — Other Ambulatory Visit: Payer: Self-pay

## 2020-10-08 ENCOUNTER — Ambulatory Visit (HOSPITAL_COMMUNITY)
Admission: EM | Admit: 2020-10-08 | Discharge: 2020-10-08 | Disposition: A | Discharge status: Home / Self Care | Payer: 59 | Attending: Emergency Medicine | Admitting: Emergency Medicine

## 2020-10-08 DIAGNOSIS — M5413 Radiculopathy, cervicothoracic region: Secondary | ICD-10-CM

## 2020-10-08 MED ORDER — TIZANIDINE HCL 4 MG PO TABS: 4.0000 mg | ORAL_TABLET | Freq: Four times a day (QID) | ORAL | 0 refills | Status: DC | PRN

## 2020-10-08 MED ORDER — PREDNISONE 10 MG PO TABS: 40.0000 mg | ORAL_TABLET | Freq: Every day | ORAL | 0 refills | Status: AC

## 2020-10-08 NOTE — ED Triage Notes (Signed)
Pt present right arm pain with numbness and tingling in her fingers. Pt state the pain starts upper arm and radiates down to forearm and hand. Pt states symptoms started this am when she woke up.
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
## 2020-10-08 NOTE — Discharge Instructions (Addendum)
Take the prednisone daily for the next 5 days.  Can also take Tylenol as needed for pain.  Once you have completed the prednisone course you can take naproxen or ibuprofen as needed for pain.  You can take the Zanaflex as needed for muscle pain and spasms.  Do not take this prior to driving as it can make you sleepy. You may apply heat for comfort.  You can also try massages, Voltaren gel, or TENS devices as needed.  Rest and drink plenty of fluids.   Return or go to the Emergency Department if symptoms worsen or do not improve in the next few days.

## 2020-10-09 ENCOUNTER — Telehealth: Payer: Self-pay | Admitting: Family Medicine

## 2020-10-09 NOTE — Telephone Encounter (Signed)
Pt is calling in stating that she went to the UC ([facility]) 10/08/2020 and they dx her with a pinched nerve and they gave prednisone 10 MG and tizandine 4 MG.  Pt is not able to sleep due to the pain have used the tens unit (twice a day) and alternating the heat pad and ice pack and massage unit on the lowest setting.  Pt would like to have a call back to let her know what else she can do since she is uncomfortable.  Pt is aware that her provider is out of the office.

## 2020-10-10 NOTE — Telephone Encounter (Signed)
Spoke with patient and an appointment scheduled with Dr Caryl Never 10/11/20

## 2020-10-11 ENCOUNTER — Encounter: Payer: Self-pay | Admitting: Family Medicine

## 2020-10-11 ENCOUNTER — Ambulatory Visit (INDEPENDENT_AMBULATORY_CARE_PROVIDER_SITE_OTHER): Payer: 59 | Admitting: Family Medicine

## 2020-10-11 ENCOUNTER — Other Ambulatory Visit: Payer: Self-pay

## 2020-10-11 VITALS — BP 142/70 | HR 63 | Temp 98.5°F | Wt 196.7 lb

## 2020-10-11 DIAGNOSIS — M792 Neuralgia and neuritis, unspecified: Secondary | ICD-10-CM

## 2020-10-11 MED ORDER — GABAPENTIN 300 MG PO CAPS: 300.0000 mg | ORAL_CAPSULE | Freq: Two times a day (BID) | ORAL | 1 refills | Status: DC
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
## 2020-10-12 ENCOUNTER — Ambulatory Visit (INDEPENDENT_AMBULATORY_CARE_PROVIDER_SITE_OTHER): Payer: 59 | Admitting: *Deleted

## 2020-10-12 DIAGNOSIS — J309 Allergic rhinitis, unspecified: Secondary | ICD-10-CM

## 2020-10-17 ENCOUNTER — Other Ambulatory Visit: Payer: Self-pay

## 2020-10-17 ENCOUNTER — Ambulatory Visit (INDEPENDENT_AMBULATORY_CARE_PROVIDER_SITE_OTHER): Payer: 59 | Admitting: Family Medicine

## 2020-10-17 ENCOUNTER — Encounter: Payer: Self-pay | Admitting: Family Medicine

## 2020-10-17 VITALS — BP 108/54 | HR 90 | Temp 98.4°F | Ht 61.0 in | Wt 197.4 lb

## 2020-10-17 DIAGNOSIS — M5412 Radiculopathy, cervical region: Secondary | ICD-10-CM | POA: Diagnosis not present

## 2020-10-17 MED ORDER — TIZANIDINE HCL 4 MG PO TABS: 4.0000 mg | ORAL_TABLET | Freq: Four times a day (QID) | ORAL | 0 refills | Status: DC | PRN
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
## 2020-10-17 NOTE — Patient Instructions (Signed)
I will set up MRI cervical spine for further assess  Go ahead and increase the Gabapentin to three times daily (every 8 hours).

## 2020-10-23 ENCOUNTER — Ambulatory Visit (INDEPENDENT_AMBULATORY_CARE_PROVIDER_SITE_OTHER): Payer: 59

## 2020-10-23 DIAGNOSIS — J309 Allergic rhinitis, unspecified: Secondary | ICD-10-CM | POA: Diagnosis not present

## 2020-10-24 DIAGNOSIS — J3089 Other allergic rhinitis: Secondary | ICD-10-CM | POA: Diagnosis not present

## 2020-10-25 ENCOUNTER — Other Ambulatory Visit: Payer: Self-pay | Admitting: Family Medicine

## 2020-10-25 ENCOUNTER — Other Ambulatory Visit: Payer: Self-pay

## 2020-10-25 ENCOUNTER — Ambulatory Visit
Admission: RE | Admit: 2020-10-25 | Discharge: 2020-10-25 | Disposition: A | Discharge status: Home / Self Care | Payer: 59 | Source: Ambulatory Visit | Attending: Family Medicine | Admitting: Family Medicine

## 2020-10-25 ENCOUNTER — Telehealth: Payer: Self-pay | Admitting: Family Medicine

## 2020-10-25 DIAGNOSIS — M5412 Radiculopathy, cervical region: Secondary | ICD-10-CM

## 2020-10-25 NOTE — Progress Notes (Signed)
VIALS MADE. EXP 10-25-21 

## 2020-10-25 NOTE — Telephone Encounter (Signed)
Patient recently had an MRI done, and was wanting to know when the results were sent to Dr. Hassan Rowan or Dr. Caryl Never (this provider ordered the MRI for her, she says). She says that she wants to know whether a follow up visit would need to be scheduled before her appointment on 8/15 for the results of the MRI.  She says that she has been out of work for the excruciating pain and would like the follow up visit to be before 8/15 so she can return to work.  Patient's contact number is [phone].  Please advise.

## 2020-10-25 NOTE — Telephone Encounter (Signed)
Last visit with Dr. Caryl Never- 10/17/20 Last refill- 10/17/20---30 tabs, no refills   No future visit scheduled. Can this patient receive a refill?

## 2020-10-26 NOTE — Telephone Encounter (Signed)
Patient informed of the message below.

## 2020-10-27 NOTE — Addendum Note (Signed)
Addended by: Kristian Covey on: 10/27/2020 07:30 AM   Modules accepted: Orders

## 2020-11-06 LAB — HM MAMMOGRAPHY

## 2020-11-06 LAB — HM PAP SMEAR: HM Pap smear: NEGATIVE

## 2020-11-08 ENCOUNTER — Telehealth: Payer: Self-pay | Admitting: Family Medicine

## 2020-11-08 NOTE — Telephone Encounter (Signed)
Error

## 2020-11-10 ENCOUNTER — Ambulatory Visit (INDEPENDENT_AMBULATORY_CARE_PROVIDER_SITE_OTHER): Payer: 59

## 2020-11-10 DIAGNOSIS — J309 Allergic rhinitis, unspecified: Secondary | ICD-10-CM

## 2020-11-14 ENCOUNTER — Ambulatory Visit: Payer: Self-pay | Admitting: Orthopedic Surgery

## 2020-11-23 ENCOUNTER — Ambulatory Visit (INDEPENDENT_AMBULATORY_CARE_PROVIDER_SITE_OTHER): Payer: 59 | Admitting: *Deleted

## 2020-11-23 DIAGNOSIS — J309 Allergic rhinitis, unspecified: Secondary | ICD-10-CM | POA: Diagnosis not present

## 2020-11-24 ENCOUNTER — Other Ambulatory Visit: Payer: Self-pay

## 2020-11-27 ENCOUNTER — Other Ambulatory Visit: Payer: Self-pay

## 2020-11-27 ENCOUNTER — Ambulatory Visit (INDEPENDENT_AMBULATORY_CARE_PROVIDER_SITE_OTHER): Payer: 59 | Admitting: Family Medicine

## 2020-11-27 ENCOUNTER — Encounter: Payer: Self-pay | Admitting: Family Medicine

## 2020-11-27 VITALS — BP 120/72 | HR 57 | Temp 98.4°F | Ht 61.0 in | Wt 194.6 lb

## 2020-11-27 DIAGNOSIS — J302 Other seasonal allergic rhinitis: Secondary | ICD-10-CM | POA: Diagnosis not present

## 2020-11-27 DIAGNOSIS — J45991 Cough variant asthma: Secondary | ICD-10-CM | POA: Diagnosis not present

## 2020-11-27 DIAGNOSIS — M542 Cervicalgia: Secondary | ICD-10-CM | POA: Diagnosis not present

## 2020-11-27 MED ORDER — GABAPENTIN 300 MG PO CAPS: ORAL_CAPSULE | ORAL | 3 refills | Status: DC

## 2020-11-27 NOTE — Progress Notes (Signed)
Rebecca Simon DOB: March 30, 1976 Encounter date: 11/27/2020  This is a 45 y.o. female who presents with Chief Complaint  Patient presents with   Follow-up    History of present illness:  Just woke up one day with pain and eventually got MRI and noted bulging disc. Right shoulder, radiates down to fingers which are numb, esp 2nd and third finger. Has surgery scheduled with Dr. Shon Baton next month. Taking gabapentin but this isn't helping much. Muscle relaxer makes her groggy.   Allergies and asthma were doing better but asthma a little worse in last couple of days. Allergy shots are every other week now. Feeling otherwise ok. Still using symbicort BID. Using albuterol prn, but hasn't used it for a month.     Allergies  Allergen Reactions   Sulfa Antibiotics Anaphylaxis and Swelling   Vicodin [Hydrocodone-Acetaminophen] Other (See Comments)    Nightmares, visual disturbances   Current Meds  Medication Sig   Albuterol Sulfate (PROAIR RESPICLICK) 108 (90 Base) MCG/ACT AEPB Inhale 2 puffs into the lungs every 4 (four) hours as needed.   azelastine (ASTELIN) 0.1 % nasal spray Place 2 sprays into both nostrils 2 (two) times daily. Use in each nostril as directed   budesonide-formoterol (SYMBICORT) 160-4.5 MCG/ACT inhaler Inhale 2 puffs into the lungs 2 (two) times daily.   fluticasone (FLONASE) 50 MCG/ACT nasal spray Place 2 sprays into both nostrils daily.   gabapentin (NEURONTIN) 300 MG capsule Take 1 capsule (300 mg total) by mouth 2 (two) times daily.   Multiple Vitamin (MULTIVITAMIN PO) Take by mouth.   Norethindrone-Ethinyl Estradiol-Fe Biphas (LO LOESTRIN FE) 1 MG-10 MCG / 10 MCG tablet Take 1 tablet by mouth every evening.   tiZANidine (ZANAFLEX) 4 MG tablet TAKE 1 TABLET(4 MG) BY MOUTH EVERY 6 HOURS AS NEEDED FOR MUSCLE SPASMS    Review of Systems  Constitutional:  Negative for chills, fatigue and fever.  Respiratory:  Negative for cough, chest tightness, shortness of breath and  wheezing.   Cardiovascular:  Negative for chest pain, palpitations and leg swelling.   Objective:  BP 120/72 (BP Location: Left Arm, Patient Position: Sitting, Cuff Size: Large)   Pulse (!) 57   Temp 98.4 F (36.9 C) (Oral)   Ht 5\' 1"  (1.549 m)   Wt 194 lb 9.6 oz (88.3 kg)   SpO2 98%   BMI 36.77 kg/m   Weight: 194 lb 9.6 oz (88.3 kg)   BP Readings from Last 3 Encounters:  11/27/20 120/72  10/17/20 (!) 108/54  10/11/20 (!) 142/70   Wt Readings from Last 3 Encounters:  11/27/20 194 lb 9.6 oz (88.3 kg)  10/17/20 197 lb 6.4 oz (89.5 kg)  10/11/20 196 lb 11.2 oz (89.2 kg)    Physical Exam Constitutional:      General: She is not in acute distress.    Appearance: She is well-developed.  Cardiovascular:     Rate and Rhythm: Normal rate and regular rhythm.     Heart sounds: Normal heart sounds. No murmur heard.   No friction rub.  Pulmonary:     Effort: Pulmonary effort is normal. No respiratory distress.     Breath sounds: Normal breath sounds. No wheezing or rales.  Musculoskeletal:     Right lower leg: No edema.     Left lower leg: No edema.  Neurological:     Mental Status: She is alert and oriented to person, place, and time.  Psychiatric:        Behavior: Behavior normal.  Assessment/Plan  1. 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 Continue with symbicort.   2. Seasonal allergies See above  3. Neck pain She has upcoming surgery next month. She is stable from medical standpoint for this. She has preop visit/labwork pending through surgeon.   Return in about 6 months (around 05/30/2021). Will get bloodwork prior to visit if able.      Theodis Shove, MD

## 2020-12-02 ENCOUNTER — Other Ambulatory Visit: Payer: Self-pay | Admitting: Family Medicine

## 2020-12-11 ENCOUNTER — Ambulatory Visit: Payer: Self-pay | Admitting: Orthopedic Surgery
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
## 2020-12-12 NOTE — Pre-Procedure Instructions (Signed)
Surgical Instructions   Your procedure is scheduled on Thursday, September 8th. Report to [facility] Main Entrance "A" at 1:30 P.M., then check in with the Admitting office. Call this number if you have problems the morning of surgery: [phone]   If you have any questions prior to your surgery date call [phone]: Open Monday-Friday 8am-4pm   Remember: Do not eat after midnight the night before your surgery  You may drink clear liquids until 12:30 PM the day of your surgery.   Clear liquids allowed are: Water, Non-Citrus Juices (without pulp), Carbonated Beverages, Clear Tea, Black Coffee ONLY (NO MILK, CREAM OR POWDERED CREAMER of any kind), and Gatorade   Take these medicines the morning of surgery with A SIP OF WATER  azelastine (ASTELIN)  budesonide-formoterol (SYMBICORT)  fluticasone (FLONASE) gabapentin (NEURONTIN)   If needed: acetaminophen (TYLENOL)  Albuterol Sulfate (PROAIR RESPICLICK)- bring with you on day of surgery tiZANidine (ZANAFLEX)   As of today, STOP taking any Aspirin (unless otherwise instructed by your surgeon) Aleve, Naproxen, Ibuprofen, Motrin, Advil, Goody's, BC's, all herbal medications, fish oil, and all vitamins.          Do not wear jewelry or makeup Do not wear lotions, powders, perfumes, or deodorant. Do not shave 48 hours prior to surgery.   Do not bring valuables to the hospital. [facility] is not responsible for any belongings or valuables. Do not wear nail polish, gel polish, artificial nails, or any other type of covering on natural nails including finger and toenails. If patients have artificial nails, gel coating, etc. that need to be removed by a nail salon please have this removed prior to surgery or surgery may need to be canceled/delayed if the surgeon/ anesthesia feels like the patient is unable to be adequately monitored.               Do NOT Smoke (Tobacco/Vaping) or drink Alcohol 24 hours prior to your procedure If you use  a CPAP at night, you may bring all equipment for your overnight stay.   Contacts, glasses, dentures or bridgework may not be worn into surgery, please bring cases for these belongings   For patients admitted to the hospital, discharge time will be determined by your treatment team.   Patients discharged the day of surgery will not be allowed to drive home, and someone needs to stay with them for 24 hours.  ONLY 1 SUPPORT PERSON MAY BE PRESENT WHILE YOU ARE IN SURGERY. IF YOU ARE TO BE ADMITTED ONCE YOU ARE IN YOUR ROOM YOU WILL BE ALLOWED TWO (2) VISITORS.  Minor children may have two parents present. Special consideration for safety and communication needs will be reviewed on a case by case basis.  Special instructions:    Oral Hygiene is also important to reduce your risk of infection.  Remember - BRUSH YOUR TEETH THE MORNING OF SURGERY WITH YOUR REGULAR TOOTHPASTE   [facility]- Preparing For Surgery  Before surgery, you can play an important role. Because skin is not sterile, your skin needs to be as free of germs as possible. You can reduce the number of germs on your skin by washing with CHG (chlorahexidine gluconate) Soap before surgery.  CHG is an antiseptic cleaner which kills germs and bonds with the skin to continue killing germs even after washing.     Please do not use if you have an allergy to CHG or antibacterial soaps. If your skin becomes reddened/irritated stop using the CHG.  Do not  shave (including legs and underarms) for at least 48 hours prior to first CHG shower. It is OK to shave your face.  Please follow these instructions carefully.     Shower the NIGHT BEFORE SURGERY and the MORNING OF SURGERY with CHG Soap.   If you chose to wash your hair, wash your hair first as usual with your normal shampoo. After you shampoo, rinse your hair and body thoroughly to remove the shampoo.  Then [employer] and genitals (private parts) with your normal soap and rinse thoroughly to  remove soap.  After that Use CHG Soap as you would any other liquid soap. You can apply CHG directly to the skin and wash gently with a scrungie or a clean washcloth.   Apply the CHG Soap to your body ONLY FROM THE NECK DOWN.  Do not use on open wounds or open sores. Avoid contact with your eyes, ears, mouth and genitals (private parts). Wash Face and genitals (private parts)  with your normal soap.   Wash thoroughly, paying special attention to the area where your surgery will be performed.  Thoroughly rinse your body with warm water from the neck down.  DO NOT shower/wash with your normal soap after using and rinsing off the CHG Soap.  Pat yourself dry with a CLEAN TOWEL.  Wear CLEAN PAJAMAS to bed the night before surgery  Place CLEAN SHEETS on your bed the night before your surgery  DO NOT SLEEP WITH PETS.   Day of Surgery:  Take a shower with CHG soap. Wear Clean/Comfortable clothing the morning of surgery Do not apply any deodorants/lotions.   Remember to brush your teeth WITH YOUR REGULAR TOOTHPASTE.   Please read over the following fact sheets that you were given.

## 2020-12-13 ENCOUNTER — Encounter (HOSPITAL_COMMUNITY): Payer: Self-pay

## 2020-12-13 ENCOUNTER — Encounter (HOSPITAL_COMMUNITY)
Admission: RE | Admit: 2020-12-13 | Discharge: 2020-12-13 | Disposition: A | Discharge status: Home / Self Care | Payer: 59 | Source: Ambulatory Visit | Attending: Orthopedic Surgery | Admitting: Orthopedic Surgery

## 2020-12-13 ENCOUNTER — Ambulatory Visit (INDEPENDENT_AMBULATORY_CARE_PROVIDER_SITE_OTHER): Payer: 59

## 2020-12-13 ENCOUNTER — Other Ambulatory Visit: Payer: Self-pay

## 2020-12-13 DIAGNOSIS — Z01812 Encounter for preprocedural laboratory examination: Secondary | ICD-10-CM | POA: Insufficient documentation

## 2020-12-13 DIAGNOSIS — J309 Allergic rhinitis, unspecified: Secondary | ICD-10-CM

## 2020-12-13 HISTORY — DX: Pneumonia, unspecified organism: J18.9

## 2020-12-13 LAB — SURGICAL PCR SCREEN
MRSA, PCR: NEGATIVE
Staphylococcus aureus: NEGATIVE

## 2020-12-13 LAB — CBC
HCT: 38.9 % (ref 36.0–46.0)
Hemoglobin: 12.5 g/dL (ref 12.0–15.0)
MCH: 30.3 pg (ref 26.0–34.0)
MCHC: 32.1 g/dL (ref 30.0–36.0)
MCV: 94.2 fL (ref 80.0–100.0)
Platelets: 338 10*3/uL (ref 150–400)
RBC: 4.13 MIL/uL (ref 3.87–5.11)
RDW: 12.3 % (ref 11.5–15.5)
WBC: 14 10*3/uL — ABNORMAL HIGH (ref 4.0–10.5)
nRBC: 0 % (ref 0.0–0.2)

## 2020-12-13 LAB — URINALYSIS, ROUTINE W REFLEX MICROSCOPIC
Bacteria, UA: NONE SEEN
Bilirubin Urine: NEGATIVE
Glucose, UA: NEGATIVE mg/dL
Ketones, ur: NEGATIVE mg/dL
Leukocytes,Ua: NEGATIVE
Nitrite: NEGATIVE
Protein, ur: NEGATIVE mg/dL
Specific Gravity, Urine: 1.003 — ABNORMAL LOW (ref 1.005–1.030)
pH: 6 (ref 5.0–8.0)

## 2020-12-13 LAB — BASIC METABOLIC PANEL
Anion gap: 7 (ref 5–15)
BUN: 7 mg/dL (ref 6–20)
CO2: 22 mmol/L (ref 22–32)
Calcium: 8.9 mg/dL (ref 8.9–10.3)
Chloride: 107 mmol/L (ref 98–111)
Creatinine, Ser: 0.96 mg/dL (ref 0.44–1.00)
GFR, Estimated: >60 mL/min (ref >60)
Glucose, Bld: 85 mg/dL (ref 70–99)
Potassium: 4 mmol/L (ref 3.5–5.1)
Sodium: 136 mmol/L (ref 135–145)

## 2020-12-13 LAB — PROTIME-INR
INR: 1 (ref 0.8–1.2)
Prothrombin Time: 13.4 seconds (ref 11.4–15.2)

## 2020-12-13 LAB — APTT: aPTT: 31 seconds (ref 24–36)

## 2020-12-13 NOTE — Progress Notes (Signed)
PCP - Theodis Shove, MD Cardiologist - pt denies  PPM/ICD - n/a  Chest x-ray - n/a EKG - n/a Stress Test - pt denies ECHO - pt denies Cardiac Cath - pt denies  Sleep Study - pt denies  Fasting Blood Sugar - n/a   Blood Thinner Instructions: n/a Aspirin Instructions: As of today, STOP taking any Aspirin (unless otherwise instructed by your surgeon)   ERAS Protcol - yes PRE-SURGERY Ensure or G2- n/a  COVID TEST- n/a; ambulatory pt  Anesthesia review: yes, surgeon request  Patient denies shortness of breath, fever, cough and chest pain at PAT appointment   All instructions explained to the patient, with a verbal understanding of the material. Patient agrees to go over the instructions while at home for a better understanding. Patient also instructed to self quarantine after being tested for COVID-19. The opportunity to ask questions was provided.

## 2020-12-14 ENCOUNTER — Encounter (HOSPITAL_COMMUNITY): Payer: Self-pay | Admitting: Physician Assistant

## 2020-12-20 ENCOUNTER — Ambulatory Visit (INDEPENDENT_AMBULATORY_CARE_PROVIDER_SITE_OTHER): Payer: 59

## 2020-12-20 DIAGNOSIS — J309 Allergic rhinitis, unspecified: Secondary | ICD-10-CM

## 2020-12-21 ENCOUNTER — Encounter (HOSPITAL_COMMUNITY): Admission: RE | Payer: Self-pay | Source: Home / Self Care

## 2020-12-21 ENCOUNTER — Encounter: Payer: Self-pay | Admitting: Family Medicine

## 2020-12-21 ENCOUNTER — Telehealth (INDEPENDENT_AMBULATORY_CARE_PROVIDER_SITE_OTHER): Payer: 59 | Admitting: Family Medicine

## 2020-12-21 ENCOUNTER — Ambulatory Visit (HOSPITAL_COMMUNITY): Admission: RE | Admit: 2020-12-21 | Payer: 59 | Source: Home / Self Care | Admitting: Orthopedic Surgery

## 2020-12-21 ENCOUNTER — Telehealth: Payer: Self-pay

## 2020-12-21 VITALS — HR 130 | Temp 97.3°F

## 2020-12-21 DIAGNOSIS — U071 COVID-19: Secondary | ICD-10-CM

## 2020-12-21 SURGERY — CERVICAL ANTERIOR DISC ARTHROPLASTY
Anesthesia: General

## 2020-12-21 MED ORDER — NIRMATRELVIR/RITONAVIR (PAXLOVID)TABLET: 3.0000 | ORAL_TABLET | Freq: Two times a day (BID) | ORAL | 0 refills | Status: AC

## 2020-12-21 MED ORDER — BENZONATATE 100 MG PO CAPS: 100.0000 mg | ORAL_CAPSULE | Freq: Three times a day (TID) | ORAL | 0 refills | Status: DC | PRN

## 2020-12-21 NOTE — Telephone Encounter (Signed)
Left a detailed message at the patients cell number to call to office to schedule a virtual visit for evaluation.

## 2020-12-21 NOTE — Patient Instructions (Addendum)
HOME CARE TIPS:  -I sent the medication(s) we discussed to your pharmacy: Meds ordered this encounter  Medications   nirmatrelvir/ritonavir EUA (PAXLOVID) 20 x 150 MG & 10 x 100MG TABS    Sig: Take 3 tablets by mouth 2 (two) times daily for 5 days. (Take nirmatrelvir 150 mg two tablets twice daily for 5 days and ritonavir 100 mg one tablet twice daily for 5 days) Patient GFR is >60    Dispense:  30 tablet    Refill:  0   benzonatate (TESSALON PERLES) 100 MG capsule    Sig: Take 1 capsule (100 mg total) by mouth 3 (three) times daily as needed.    Dispense:  20 capsule    Refill:  0     -I sent in the [place] treatment or referral you requested per our discussion. Please see the information provided below and discuss further with the pharmacist/treatment team.  -If taking Paxlovid, please review all medications, supplement and over the counter drugs with your pharmacist and ask them to check for any interactions. Please make the following changes to your regular medications while taking Paxlovid: *since almost to your sugar pills, STOP the birth control while on Paxlovid and restart your next pack  3 days after finishing the Paxlovid. USE BACK UP pregnancy prevention for the next month and until the following menstrual cycle.   -If taking Paxlovid, there is a chance of rebound illness after finishing your treatment. If you become sick again please isolate for an additional 5 days.    -can use tylenol or aleve if needed for fevers, aches and pains per instructions  -can use nasal saline a few times per day if you have nasal congestion; sometimes  a short course of Afrin nasal spray for 3 days can help with symptoms as well  -stay hydrated, drink plenty of fluids and eat small healthy meals - avoid dairy   -If the Covid test is positive, check out the [facility] website for more information on home care, transmission and treatment for COVID19  -follow up with your doctor in 2-3 days unless  improving and feeling better  -stay home while sick, except to seek medical care. If you have COVID19, ideally it would be best to stay home for a full 10 days since the onset of symptoms PLUS one day of no fever and feeling better. Wear a good mask that fits snugly (such as N95 or KN95) if around others to reduce the risk of transmission.  It was nice to meet you today, and I really hope you are feeling better soon. I help [facility] out with telemedicine visits on Tuesdays and Thursdays and am available for visits on those days. If you have any concerns or questions following this visit please schedule a follow up visit with your Primary Care doctor or seek care at a local urgent care clinic to avoid delays in care.    Seek in person care or schedule a follow up video visit promptly if your symptoms worsen, new concerns arise or you are not improving with treatment. Call 911 and/or seek emergency care if your symptoms are severe or life threatening.  FACT SHEET FOR PATIENTS, PARENTS, AND CAREGIVERS EMERGENCY USE AUTHORIZATION (EUA) OF PAXLOVID FOR CORONAVIRUS DISEASE 2019 (COVID-19) You are being given this Fact Sheet because your healthcare provider believes it is necessary to provide you with PAXLOVID for the treatment of mild-to-moderate coronavirus disease (COVID-19) caused by the SARS-CoV-2 virus. This Fact Sheet contains information to  help you understand the risks and benefits of taking the PAXLOVID you have received or may receive. The U.S. Food and Drug Administration (FDA) has issued an Emergency Use Authorization (EUA) to make PAXLOVID available during the COVID-19 pandemic (for more details about an EUA please see "What is an Emergency Use Authorization?" at the end of this document). PAXLOVID is not an FDA-approved medicine in the [place]. Read this Fact Sheet for information about PAXLOVID. Talk to your healthcare provider about your options or if you have any questions.  It is your choice to take PAXLOVID.  What is COVID-19? COVID-19 is caused by a virus called a coronavirus. You can get COVID-19 through close contact with another person who has the virus. COVID-19 illnesses have ranged from very mild-to-severe, including illness resulting in death. While information so far suggests that most COVID-19 illness is mild, serious illness can happen and may cause some of your other medical conditions to become worse. Older people and people of all ages with severe, long lasting (chronic) medical conditions like heart disease, lung disease, and diabetes, for example seem to be at higher risk of being hospitalized for COVID-19.  What is PAXLOVID? PAXLOVID is an investigational medicine used to treat mild-to-moderate COVID-19 in adults and children [59 years of age and older weighing at least 30 pounds (81 kg)] with positive results of direct SARS-CoV-2 viral testing, and who are at high risk for progression to severe COVID-19, including hospitalization or death. PAXLOVID is investigational because it is still being studied. There is limited information about the safety and effectiveness of using PAXLOVID to treat people with mild-to-moderate COVID-19.  The FDA has authorized the emergency use of PAXLOVID for the treatment of mild-tomoderate COVID-19 in adults and children [54 years of age and older weighing at least 47 pounds (36 kg)] with a positive test for the virus that causes COVID-19, and who are at high risk for progression to severe COVID-19, including hospitalization or death, under an EUA. 1 Revised: 30 June 2020   What should I tell my healthcare provider before I take PAXLOVID? Tell your healthcare provider if you: ? Have any allergies ? Have liver or kidney disease ? Are pregnant or plan to become pregnant ? Are breastfeeding a child ? Have any serious illnesses  Tell your healthcare provider about all the medicines you take,  including prescription and over-the-counter medicines, vitamins, and herbal supplements. Some medicines may interact with PAXLOVID and may cause serious side effects. Keep a list of your medicines to show your healthcare provider and pharmacist when you get a new medicine.  You can ask your healthcare provider or pharmacist for a list of medicines that interact with PAXLOVID. Do not start taking a new medicine without telling your healthcare provider. Your healthcare provider can tell you if it is safe to take PAXLOVID with other medicines.  Tell your healthcare provider if you are taking combined hormonal contraceptive. PAXLOVID may affect how your birth control pills work. Females who are able to become pregnant should use another effective alternative form of contraception or an additional barrier method of contraception. Talk to your healthcare provider if you have any questions about contraceptive methods that might be right for you.  How do I take PAXLOVID? ? PAXLOVID consists of 2 medicines: nirmatrelvir and ritonavir. o Take 2 pink tablets of nirmatrelvir with 1 white tablet of ritonavir by mouth 2 times each day (in the morning and in the evening) for 5 days. For each dose,  take all 3 tablets at the same time. o If you have kidney disease, talk to your healthcare provider. You may need a different dose. ? Swallow the tablets whole. Do not chew, break, or crush the tablets. ? Take PAXLOVID with or without food. ? Do not stop taking PAXLOVID without talking to your healthcare provider, even if you feel better. ? If you miss a dose of PAXLOVID within 8 hours of the time it is usually taken, take it as soon as you remember. If you miss a dose by more than 8 hours, skip the missed dose and take the next dose at your regular time. Do not take 2 doses of PAXLOVID at the same time. ? If you take too much PAXLOVID, call your healthcare provider or go to the nearest hospital emergency  room right away. ? If you are taking a ritonavir- or cobicistat-containing medicine to treat hepatitis C or Human Immunodeficiency Virus (HIV), you should continue to take your medicine as prescribed by your healthcare provider. 2 Revised: 30 June 2020    Talk to your healthcare provider if you do not feel better or if you feel worse after 5 days.  Who should generally not take PAXLOVID? Do not take PAXLOVID if: ? You are allergic to nirmatrelvir, ritonavir, or any of the ingredients in PAXLOVID. ? You are taking any of the following medicines: o Alfuzosin o Pethidine, propoxyphene o Ranolazine o Amiodarone, dronedarone, flecainide, propafenone, quinidine o Colchicine o Lurasidone, pimozide, clozapine o Dihydroergotamine, ergotamine, methylergonovine o Lovastatin, simvastatin o Sildenafil (Revatio) for pulmonary arterial hypertension (PAH) o Triazolam, oral midazolam o Apalutamide o Carbamazepine, phenobarbital, phenytoin o Rifampin o St. John's Wort (hypericum perforatum) Taking PAXLOVID with these medicines may cause serious or life-threatening side effects or affect how PAXLOVID works.  These are not the only medicines that may cause serious side effects if taken with PAXLOVID. PAXLOVID may increase or decrease the levels of multiple other medicines. It is very important to tell your healthcare provider about all of the medicines you are taking because additional laboratory tests or changes in the dose of your other medicines may be necessary while you are taking PAXLOVID. Your healthcare provider may also tell you about specific symptoms to watch out for that may indicate that you need to stop or decrease the dose of some of your other medicines.  What are the important possible side effects of PAXLOVID? Possible side effects of PAXLOVID are: ? Allergic Reactions. Allergic reactions can happen in people taking PAXLOVID, even after only 1 dose. Stop taking PAXLOVID and  call your healthcare provider right away if you get any of the following symptoms of an allergic reaction: o hives o trouble swallowing or breathing o swelling of the mouth, lips, or face o throat tightness o hoarseness 3 Revised: 30 June 2020  o skin rash ? Liver Problems. Tell your healthcare provider right away if you have any of these signs and symptoms of liver problems: loss of appetite, yellowing of your skin and the whites of eyes (jaundice), dark-colored urine, pale colored stools and itchy skin, stomach area (abdominal) pain. ? Resistance to HIV Medicines. If you have untreated HIV infection, PAXLOVID may lead to some HIV medicines not working as well in the future. ? Other possible side effects include: o altered sense of taste o diarrhea o high blood pressure o muscle aches These are not all the possible side effects of PAXLOVID. Not many people have taken PAXLOVID. Serious and unexpected side effects  may happen. PAXLOVID is still being studied, so it is possible that all of the risks are not known at this time.  What other treatment choices are there? Veklury (remdesivir) is FDA-approved for the treatment of mild-to-moderate ZYSAY-30 in certain adults and children. Talk with your doctor to see if Marijean Heath is appropriate for you. Like PAXLOVID, FDA may also allow for the emergency use of other medicines to treat people with COVID-19. Go to https://price.info/ for information on the emergency use of other medicines that are authorized by FDA to treat people with COVID-19. Your healthcare provider may talk with you about clinical trials for which you may be eligible. It is your choice to be treated or not to be treated with PAXLOVID. Should you decide not to receive it or for your child not to receive it, it will not change your standard medical care.  What if I am  pregnant or breastfeeding? There is no experience treating pregnant women or breastfeeding mothers with PAXLOVID. For a mother and unborn baby, the benefit of taking PAXLOVID may be greater than the risk from the treatment. If you are pregnant, discuss your options and specific situation with your healthcare provider. It is recommended that you use effective barrier contraception or do not have sexual activity while taking PAXLOVID. If you are breastfeeding, discuss your options and specific situation with your healthcare provider. 4 Revised: 30 June 2020   How do I report side effects with PAXLOVID? Contact your healthcare provider if you have any side effects that bother you or do not go away. Report side effects to FDA MedWatch at SmoothHits.hu or call 1-800-FDA1088 or you can report side effects to [employer]. at the contact information provided below. Website Fax number Telephone number www.pfizersafetyreporting.com ([phone] How should I store [place]? Store PAXLOVID tablets at room temperature, between 68?F to 77?F (20?C to 25?C). How can I learn more about COVID-19? ? Ask your healthcare provider. ? Visit https://jacobson-johnson.com/. ? Contact your local or state public health department. What is an Emergency Use Authorization (EUA)? The [place] FDA has made PAXLOVID available under an emergency access mechanism called an Emergency Use Authorization (EUA). The EUA is supported by a Education officer, museum and Human Service (HHS) declaration that circumstances exist to justify the emergency use of drugs and biological products during the COVID-19 pandemic. PAXLOVID for the treatment of mild-to-moderate COVID-19 in adults and children [103 years of age and older weighing at least 46 pounds (48 kg)] with positive results of direct SARS-CoV-2 viral testing, and who are at high risk for progression to severe COVID-19, including hospitalization or  death, has not undergone the same type of review as an FDA-approved product. In issuing an EUA under the WCBJS-28 public health emergency, the FDA has determined, among other things, that based on the total amount of scientific evidence available including data from adequate and well-controlled clinical trials, if available, it is reasonable to believe that the product may be effective for diagnosing, treating, or preventing COVID-19, or a serious or life-threatening disease or condition caused by COVID-19; that the known and potential benefits of the product, when used to diagnose, treat, or prevent such disease or condition, outweigh the known and potential risks of such product; and that there are no adequate, approved, and available alternatives. All of these criteria must be met to allow for the product to be used in the treatment of patients during the COVID-19 pandemic. The EUA for PAXLOVID is in effect for the duration  of the COVID-19 declaration justifying emergency use of this product, unless terminated or revoked (after which the products may no longer be used under the EUA). 5 Revised: 30 June 2020     Additional Information For general questions, visit the website or call the telephone number provided below. Website Telephone number www.COVID19oralRx.com [phone] (1-877-C19-PACK) You can also go to www.pfizermedinfo.com or call [phone] for more information. [id number] Revised: 30 June 2020

## 2020-12-21 NOTE — Progress Notes (Addendum)
Patient called and stated that she tested positive for covid this morning on a home test.   Message left for Dr. Shon Baton, awaiting a return call.    Dr. Armond Hang is aware.   Addendum: Dr. Shon Baton is aware of patient's covid test result and stated that case will be delayed.    Patient updated and instructed to reach out to office to re-schedule.

## 2020-12-21 NOTE — Telephone Encounter (Signed)
Patient called stating her daughter tested positive for Covid Monday and the patient tested positive for Covid Wednesday night Patient is suppose to have surgery and needs documentation of results patient stated she took a home test patient also requested a call back to discuss.
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
## 2020-12-22 ENCOUNTER — Telehealth: Payer: Self-pay

## 2020-12-22 NOTE — Telephone Encounter (Signed)
Patient called requesting Documents from  virtual visit to be faxed to Dr. Shon Baton office so they can reschedule pt surgery.

## 2020-12-22 NOTE — Telephone Encounter (Signed)
Ok to fax virtual note.

## 2020-12-25 ENCOUNTER — Other Ambulatory Visit: Payer: Self-pay | Admitting: Family Medicine

## 2020-12-25 NOTE — Telephone Encounter (Signed)
Notes from the virtual visit from 12/21/20 were faxed to Dr Shon Baton' office (attn: surgery scheduler) at [phone].

## 2020-12-28 ENCOUNTER — Ambulatory Visit (INDEPENDENT_AMBULATORY_CARE_PROVIDER_SITE_OTHER): Payer: 59 | Admitting: *Deleted

## 2020-12-28 DIAGNOSIS — J309 Allergic rhinitis, unspecified: Secondary | ICD-10-CM | POA: Diagnosis not present

## 2020-12-29 ENCOUNTER — Other Ambulatory Visit: Payer: Self-pay | Admitting: Allergy

## 2020-12-30 DIAGNOSIS — U071 COVID-19: Secondary | ICD-10-CM

## 2020-12-30 HISTORY — DX: COVID-19: U07.1

## 2021-01-02 ENCOUNTER — Ambulatory Visit (INDEPENDENT_AMBULATORY_CARE_PROVIDER_SITE_OTHER): Payer: 59 | Admitting: *Deleted

## 2021-01-02 DIAGNOSIS — J309 Allergic rhinitis, unspecified: Secondary | ICD-10-CM | POA: Diagnosis not present

## 2021-01-04 ENCOUNTER — Ambulatory Visit: Payer: Self-pay | Admitting: Orthopedic Surgery

## 2021-01-11 NOTE — Pre-Procedure Instructions (Signed)
Surgical Instructions    Your procedure is scheduled on Thursday, October 6th.  Report to [facility] Main Entrance "A" at 9:00 A.M., then check in with the Admitting office.  Call this number if you have problems the morning of surgery:  [phone]   If you have any questions prior to your surgery date call [phone]: Open Monday-Friday 8am-4pm    Remember:  Do not eat after midnight the night before your surgery  You may drink clear liquids until 8:00 a.m. the morning of your surgery.   Clear liquids allowed are: Water, Non-Citrus Juices (without pulp), Carbonated Beverages, Clear Tea, Black Coffee Only, and Gatorade.    Take these medicines the morning of surgery with A SIP OF WATER  gabapentin (NEURONTIN)  fluticasone (FLONASE) nasal spray azelastine (ASTELIN) nasal spray budesonide-formoterol (SYMBICORT)  As needed: acetaminophen (TYLENOL)  Albuterol Sulfate (PROAIR RESPICLICK)  tiZANidine (ZANAFLEX)  As of today, STOP taking any Aspirin (unless otherwise instructed by your surgeon) Aleve, Naproxen, Ibuprofen, Motrin, Advil, Goody's, BC's, all herbal medications, fish oil, and all vitamins.                     Do NOT Smoke (Tobacco/Vaping) or drink Alcohol 24 hours prior to your procedure.  If you use a CPAP at night, you may bring all equipment for your overnight stay.   Contacts, glasses, piercing's, hearing aid's, dentures or partials may not be worn into surgery, please bring cases for these belongings.    For patients admitted to the hospital, discharge time will be determined by your treatment team.   Patients discharged the day of surgery will not be allowed to drive home, and someone needs to stay with them for 24 hours.  NO VISITORS WILL BE ALLOWED IN PRE-OP WHERE PATIENTS GET READY FOR SURGERY.  ONLY 1 SUPPORT PERSON MAY BE PRESENT IN THE WAITING ROOM WHILE YOU ARE IN SURGERY.  IF YOU ARE TO BE ADMITTED, ONCE YOU ARE IN YOUR ROOM YOU WILL BE ALLOWED TWO (2)  VISITORS.  Minor children may have two parents present. Special consideration for safety and communication needs will be reviewed on a case by case basis.   Special instructions:   [facility]- Preparing For Surgery  Before surgery, you can play an important role. Because skin is not sterile, your skin needs to be as free of germs as possible. You can reduce the number of germs on your skin by washing with CHG (chlorahexidine gluconate) Soap before surgery.  CHG is an antiseptic cleaner which kills germs and bonds with the skin to continue killing germs even after washing.    Oral Hygiene is also important to reduce your risk of infection.  Remember - BRUSH YOUR TEETH THE MORNING OF SURGERY WITH YOUR REGULAR TOOTHPASTE  Please do not use if you have an allergy to CHG or antibacterial soaps. If your skin becomes reddened/irritated stop using the CHG.  Do not shave (including legs and underarms) for at least 48 hours prior to first CHG shower. It is OK to shave your face.  Please follow these instructions carefully.   Shower the NIGHT BEFORE SURGERY and the MORNING OF SURGERY  If you chose to wash your hair, wash your hair first as usual with your normal shampoo.  After you shampoo, rinse your hair and body thoroughly to remove the shampoo.  Use CHG Soap as you would any other liquid soap. You can apply CHG directly to the skin and wash gently with a  scrungie or a clean washcloth.   Apply the CHG Soap to your body ONLY FROM THE NECK DOWN.  Do not use on open wounds or open sores. Avoid contact with your eyes, ears, mouth and genitals (private parts). Wash Face and genitals (private parts)  with your normal soap.   Wash thoroughly, paying special attention to the area where your surgery will be performed.  Thoroughly rinse your body with warm water from the neck down.  DO NOT shower/wash with your normal soap after using and rinsing off the CHG Soap.  Pat yourself dry with a CLEAN  TOWEL.  Wear CLEAN PAJAMAS to bed the night before surgery  Place CLEAN SHEETS on your bed the night before your surgery  DO NOT SLEEP WITH PETS.   Day of Surgery: Shower with CHG soap. Do not wear jewelry, make up, nail polish, gel polish, artificial nails, or any other type of covering on natural nails including finger and toenails. If patients have artificial nails, gel coating, etc. that need to be removed by a nail salon please have this removed prior to surgery. Surgery may need to be canceled/delayed if the surgeon/ anesthesia feels like the patient is unable to be adequately monitored. Do not wear lotions, powders, perfumes/colognes, or deodorant. Do not shave 48 hours prior to surgery.  Men may shave face and neck. Do not bring valuables to the hospital. [facility] is not responsible for any belongings or valuables. Wear Clean/Comfortable clothing the morning of surgery Remember to brush your teeth WITH YOUR REGULAR TOOTHPASTE.   Please read over the following fact sheets that you were given.   3 days prior to your procedure or After your COVID test   You are not required to quarantine however you are required to wear a well-fitting mask when you are out and around people not in your household. If your mask becomes wet or soiled, replace with a new one.   Wash your hands often with soap and water for 20 seconds or clean your hands with an alcohol-based hand sanitizer that contains at least 60% alcohol.   Do not share personal items.   Notify your provider:  o if you are in close contact with someone who has COVID  o or if you develop a fever of 100.4 or greater, sneezing, cough, sore throat, shortness of breath or body aches.

## 2021-01-12 ENCOUNTER — Encounter (HOSPITAL_COMMUNITY)
Admission: RE | Admit: 2021-01-12 | Discharge: 2021-01-12 | Disposition: A | Discharge status: Home / Self Care | Payer: 59 | Source: Ambulatory Visit | Attending: Orthopedic Surgery | Admitting: Orthopedic Surgery

## 2021-01-12 ENCOUNTER — Other Ambulatory Visit: Payer: Self-pay

## 2021-01-12 ENCOUNTER — Encounter (HOSPITAL_COMMUNITY): Payer: Self-pay

## 2021-01-12 DIAGNOSIS — Z01812 Encounter for preprocedural laboratory examination: Secondary | ICD-10-CM | POA: Diagnosis present

## 2021-01-12 LAB — URINALYSIS, ROUTINE W REFLEX MICROSCOPIC
Bacteria, UA: NONE SEEN
Bilirubin Urine: NEGATIVE
Glucose, UA: NEGATIVE mg/dL
Ketones, ur: NEGATIVE mg/dL
Leukocytes,Ua: NEGATIVE
Nitrite: NEGATIVE
Protein, ur: NEGATIVE mg/dL
Specific Gravity, Urine: 1.004 — ABNORMAL LOW (ref 1.005–1.030)
pH: 6 (ref 5.0–8.0)

## 2021-01-12 LAB — APTT: aPTT: 30 seconds (ref 24–36)

## 2021-01-12 LAB — CBC
HCT: 40.3 % (ref 36.0–46.0)
Hemoglobin: 12.5 g/dL (ref 12.0–15.0)
MCH: 29.9 pg (ref 26.0–34.0)
MCHC: 31 g/dL (ref 30.0–36.0)
MCV: 96.4 fL (ref 80.0–100.0)
Platelets: 306 10*3/uL (ref 150–400)
RBC: 4.18 MIL/uL (ref 3.87–5.11)
RDW: 13.3 % (ref 11.5–15.5)
WBC: 12.1 10*3/uL — ABNORMAL HIGH (ref 4.0–10.5)
nRBC: 0 % (ref 0.0–0.2)

## 2021-01-12 LAB — BASIC METABOLIC PANEL
Anion gap: 10 (ref 5–15)
BUN: 9 mg/dL (ref 6–20)
CO2: 22 mmol/L (ref 22–32)
Calcium: 8.7 mg/dL — ABNORMAL LOW (ref 8.9–10.3)
Chloride: 105 mmol/L (ref 98–111)
Creatinine, Ser: 1.04 mg/dL — ABNORMAL HIGH (ref 0.44–1.00)
GFR, Estimated: >60 mL/min (ref >60)
Glucose, Bld: 86 mg/dL (ref 70–99)
Potassium: 3.3 mmol/L — ABNORMAL LOW (ref 3.5–5.1)
Sodium: 137 mmol/L (ref 135–145)

## 2021-01-12 LAB — PROTIME-INR
INR: 1 (ref 0.8–1.2)
Prothrombin Time: 13.3 seconds (ref 11.4–15.2)

## 2021-01-12 LAB — PREGNANCY, URINE: Preg Test, Ur: NEGATIVE

## 2021-01-12 LAB — SURGICAL PCR SCREEN
MRSA, PCR: NEGATIVE
Staphylococcus aureus: NEGATIVE

## 2021-01-12 NOTE — Progress Notes (Signed)
PCP - Dr. Theodis Shove Cardiologist - pt denies   Chest x-ray - n/a EKG - n/a Stress Test - pt denies ECHO - pt denies Cardiac Cath - pt denies   Sleep Study - pt denies   Blood Thinner Instructions: n/a Aspirin Instructions: n/a  ERAS Protcol - clear liquids until 0800 DOS. PRE-SURGERY Ensure or G2- n/a   COVID TEST- n/a; ambulatory pt  Pt positive within the last 90 days on 12/20/20 on a home test. Pt was prescribed Paxlovid for treatment of Covid-19 by PCP.    Anesthesia review: yes, per order.   Patient denies shortness of breath, fever, cough and chest pain at PAT appointment     All instructions explained to the patient, with a verbal understanding of the material. Patient agrees to go over the instructions while at home for a better understanding. Patient also instructed to self quarantine after being tested for COVID-19. The opportunity to ask questions was provided.

## 2021-01-16 ENCOUNTER — Ambulatory Visit (INDEPENDENT_AMBULATORY_CARE_PROVIDER_SITE_OTHER): Payer: 59 | Admitting: *Deleted

## 2021-01-16 DIAGNOSIS — J309 Allergic rhinitis, unspecified: Secondary | ICD-10-CM | POA: Diagnosis not present

## 2021-01-18 ENCOUNTER — Ambulatory Visit (HOSPITAL_COMMUNITY): Payer: 59

## 2021-01-18 ENCOUNTER — Encounter (HOSPITAL_COMMUNITY)
Admission: RE | Disposition: A | Discharge status: Home / Self Care | Payer: Self-pay | Source: Home / Self Care | Attending: Orthopedic Surgery

## 2021-01-18 ENCOUNTER — Ambulatory Visit: Payer: 59 | Admitting: Internal Medicine

## 2021-01-18 ENCOUNTER — Ambulatory Visit: Payer: Self-pay | Admitting: Orthopedic Surgery

## 2021-01-18 ENCOUNTER — Ambulatory Visit (HOSPITAL_COMMUNITY)
Admission: RE | Admit: 2021-01-18 | Discharge: 2021-01-18 | Disposition: A | Discharge status: Home / Self Care | Payer: 59 | Attending: Orthopedic Surgery | Admitting: Orthopedic Surgery

## 2021-01-18 ENCOUNTER — Ambulatory Visit (HOSPITAL_COMMUNITY): Payer: 59 | Admitting: Anesthesiology

## 2021-01-18 DIAGNOSIS — Z87891 Personal history of nicotine dependence: Secondary | ICD-10-CM | POA: Diagnosis not present

## 2021-01-18 DIAGNOSIS — E669 Obesity, unspecified: Secondary | ICD-10-CM | POA: Insufficient documentation

## 2021-01-18 DIAGNOSIS — Z419 Encounter for procedure for purposes other than remedying health state, unspecified: Secondary | ICD-10-CM

## 2021-01-18 DIAGNOSIS — J45909 Unspecified asthma, uncomplicated: Secondary | ICD-10-CM | POA: Insufficient documentation

## 2021-01-18 DIAGNOSIS — Z8616 Personal history of COVID-19: Secondary | ICD-10-CM | POA: Diagnosis not present

## 2021-01-18 DIAGNOSIS — M50123 Cervical disc disorder at C6-C7 level with radiculopathy: Secondary | ICD-10-CM | POA: Diagnosis not present

## 2021-01-18 DIAGNOSIS — Z6837 Body mass index (BMI) 37.0-37.9, adult: Secondary | ICD-10-CM | POA: Diagnosis not present

## 2021-01-18 DIAGNOSIS — Z885 Allergy status to narcotic agent status: Secondary | ICD-10-CM | POA: Insufficient documentation

## 2021-01-18 DIAGNOSIS — Z882 Allergy status to sulfonamides status: Secondary | ICD-10-CM | POA: Insufficient documentation

## 2021-01-18 HISTORY — PX: CERVICAL DISC ARTHROPLASTY: SHX587

## 2021-01-18 LAB — POCT PREGNANCY, URINE: Preg Test, Ur: NEGATIVE

## 2021-01-18 SURGERY — CERVICAL ANTERIOR DISC ARTHROPLASTY
Anesthesia: General | Site: Neck

## 2021-01-18 MED ORDER — OXYCODONE HCL 5 MG PO TABS
10.0000 mg | ORAL_TABLET | ORAL | Status: DC | PRN
Start: 2021-01-18 — End: 2021-01-18

## 2021-01-18 MED ORDER — DEXAMETHASONE SODIUM PHOSPHATE 10 MG/ML IJ SOLN
INTRAMUSCULAR | Status: AC
  Filled 2021-01-18: qty 1

## 2021-01-18 MED ORDER — OXYCODONE HCL 5 MG PO TABS: 5.0000 mg | ORAL_TABLET | ORAL | Status: DC | PRN

## 2021-01-18 MED ORDER — METHOCARBAMOL 500 MG PO TABS: 500.0000 mg | ORAL_TABLET | Freq: Three times a day (TID) | ORAL | 0 refills | Status: AC | PRN

## 2021-01-18 MED ORDER — MIDAZOLAM HCL 2 MG/2ML IJ SOLN
INTRAMUSCULAR | Status: AC
  Filled 2021-01-18: qty 2

## 2021-01-18 MED ORDER — MIDAZOLAM HCL 5 MG/5ML IJ SOLN
INTRAMUSCULAR | Status: DC | PRN
Start: 2021-01-18 — End: 2021-01-18
  Administered 2021-01-18: 2 mg via INTRAVENOUS

## 2021-01-18 MED ORDER — PHENYLEPHRINE HCL-NACL 20-0.9 MG/250ML-% IV SOLN
INTRAVENOUS | Status: DC | PRN
  Administered 2021-01-18: 30 ug/min via INTRAVENOUS

## 2021-01-18 MED ORDER — ROCURONIUM BROMIDE 10 MG/ML (PF) SYRINGE
PREFILLED_SYRINGE | INTRAVENOUS | Status: AC
  Filled 2021-01-18: qty 10

## 2021-01-18 MED ORDER — PROPOFOL 10 MG/ML IV BOLUS
INTRAVENOUS | Status: AC
  Filled 2021-01-18: qty 20

## 2021-01-18 MED ORDER — LACTATED RINGERS IV SOLN: INTRAVENOUS | Status: DC

## 2021-01-18 MED ORDER — ONDANSETRON HCL 4 MG/2ML IJ SOLN
INTRAMUSCULAR | Status: DC | PRN
  Administered 2021-01-18: 4 mg via INTRAVENOUS

## 2021-01-18 MED ORDER — ONDANSETRON HCL 4 MG/2ML IJ SOLN
INTRAMUSCULAR | Status: AC
  Filled 2021-01-18: qty 2

## 2021-01-18 MED ORDER — ONDANSETRON HCL 4 MG PO TABS: 4.0000 mg | ORAL_TABLET | Freq: Three times a day (TID) | ORAL | 0 refills | Status: DC | PRN

## 2021-01-18 MED ORDER — LIDOCAINE HCL (CARDIAC) PF 100 MG/5ML IV SOSY
PREFILLED_SYRINGE | INTRAVENOUS | Status: DC | PRN
Start: 2021-01-18 — End: 2021-01-18
  Administered 2021-01-18: 60 mg via INTRAVENOUS

## 2021-01-18 MED ORDER — OXYCODONE-ACETAMINOPHEN 10-325 MG PO TABS: 1.0000 | ORAL_TABLET | Freq: Four times a day (QID) | ORAL | 0 refills | Status: AC | PRN

## 2021-01-18 MED ORDER — DEXAMETHASONE SODIUM PHOSPHATE 10 MG/ML IJ SOLN
INTRAMUSCULAR | Status: DC | PRN
  Administered 2021-01-18: 10 mg via INTRAVENOUS

## 2021-01-18 MED ORDER — FENTANYL CITRATE (PF) 250 MCG/5ML IJ SOLN
INTRAMUSCULAR | Status: AC
  Filled 2021-01-18: qty 5

## 2021-01-18 MED ORDER — FENTANYL CITRATE (PF) 100 MCG/2ML IJ SOLN
INTRAMUSCULAR | Status: DC | PRN
  Administered 2021-01-18: 100 ug via INTRAVENOUS
  Administered 2021-01-18: 50 ug via INTRAVENOUS
  Administered 2021-01-18 (×2): 100 ug via INTRAVENOUS

## 2021-01-18 MED ORDER — ROCURONIUM BROMIDE 100 MG/10ML IV SOLN
INTRAVENOUS | Status: DC | PRN
Start: 2021-01-18 — End: 2021-01-18
  Administered 2021-01-18: 50 mg via INTRAVENOUS
  Administered 2021-01-18 (×2): 10 mg via INTRAVENOUS
  Administered 2021-01-18: 30 mg via INTRAVENOUS

## 2021-01-18 MED ORDER — BUPIVACAINE-EPINEPHRINE 0.25% -1:200000 IJ SOLN
INTRAMUSCULAR | Status: DC | PRN
  Administered 2021-01-18: 10 mL

## 2021-01-18 MED ORDER — ORAL CARE MOUTH RINSE: 15.0000 mL | Freq: Once | OROMUCOSAL | Status: AC

## 2021-01-18 MED ORDER — AMISULPRIDE (ANTIEMETIC) 5 MG/2ML IV SOLN
INTRAVENOUS | Status: AC
  Filled 2021-01-18: qty 4

## 2021-01-18 MED ORDER — EPHEDRINE SULFATE 50 MG/ML IJ SOLN
INTRAMUSCULAR | Status: DC | PRN
  Administered 2021-01-18: 10 mg via INTRAVENOUS

## 2021-01-18 MED ORDER — AMISULPRIDE (ANTIEMETIC) 5 MG/2ML IV SOLN
10.0000 mg | Freq: Once | INTRAVENOUS | Status: AC | PRN
  Administered 2021-01-18: 10 mg via INTRAVENOUS

## 2021-01-18 MED ORDER — ACETAMINOPHEN 10 MG/ML IV SOLN: 1000.0000 mg | Freq: Once | INTRAVENOUS | Status: DC | PRN

## 2021-01-18 MED ORDER — PROPOFOL 10 MG/ML IV BOLUS
INTRAVENOUS | Status: DC | PRN
  Administered 2021-01-18: 200 mg via INTRAVENOUS

## 2021-01-18 MED ORDER — FENTANYL CITRATE (PF) 100 MCG/2ML IJ SOLN: 25.0000 ug | INTRAMUSCULAR | Status: DC | PRN

## 2021-01-18 MED ORDER — 0.9 % SODIUM CHLORIDE (POUR BTL) OPTIME
TOPICAL | Status: DC | PRN
  Administered 2021-01-18: 1000 mL

## 2021-01-18 MED ORDER — LIDOCAINE 2% (20 MG/ML) 5 ML SYRINGE
INTRAMUSCULAR | Status: AC
  Filled 2021-01-18: qty 5

## 2021-01-18 MED ORDER — PROMETHAZINE HCL 25 MG/ML IJ SOLN: 6.2500 mg | INTRAMUSCULAR | Status: DC | PRN

## 2021-01-18 MED ORDER — SUGAMMADEX SODIUM 500 MG/5ML IV SOLN
INTRAVENOUS | Status: DC | PRN
  Administered 2021-01-18: 400 mg via INTRAVENOUS

## 2021-01-18 MED ORDER — CEFAZOLIN SODIUM-DEXTROSE 2-4 GM/100ML-% IV SOLN
2.0000 g | INTRAVENOUS | Status: AC
  Administered 2021-01-18: 2 g via INTRAVENOUS
  Filled 2021-01-18: qty 100

## 2021-01-18 MED ORDER — CHLORHEXIDINE GLUCONATE 0.12 % MT SOLN
15.0000 mL | Freq: Once | OROMUCOSAL | Status: AC
  Administered 2021-01-18: 15 mL via OROMUCOSAL
  Filled 2021-01-18: qty 15

## 2021-01-18 SURGICAL SUPPLY — 57 items
AGENT HMST KT MTR STRL THRMB (HEMOSTASIS) ×1
BAG COUNTER SPONGE SURGICOUNT (BAG) ×2 IMPLANT
BAG SPNG CNTER NS LX DISP (BAG) ×1
BIT DRILL FLUTELESS (BIT) ×1 IMPLANT
CAGE PRESTIGE LP 5X14 (Cage) ×1 IMPLANT
CANISTER SUCT 3000ML PPV (MISCELLANEOUS) ×2 IMPLANT
CLSR STERI-STRIP ANTIMIC 1/2X4 (GAUZE/BANDAGES/DRESSINGS) ×2 IMPLANT
COLLAR CERV LO CONTOUR FIRM DE (SOFTGOODS) ×1 IMPLANT
COVER MAYO STAND STRL (DRAPES) ×4 IMPLANT
COVER SURGICAL LIGHT HANDLE (MISCELLANEOUS) ×4 IMPLANT
DRAIN CHANNEL 15F RND FF W/TCR (WOUND CARE) IMPLANT
DRAPE C-ARM 42X72 X-RAY (DRAPES) ×2 IMPLANT
DRAPE C-ARMOR (DRAPES) ×1 IMPLANT
DRAPE POUCH INSTRU U-SHP 10X18 (DRAPES) ×2 IMPLANT
DRAPE SURG 17X23 STRL (DRAPES) ×2 IMPLANT
DRAPE U-SHAPE 47X51 STRL (DRAPES) ×2 IMPLANT
DRSG OPSITE POSTOP 3X4 (GAUZE/BANDAGES/DRESSINGS) ×1 IMPLANT
DRSG OPSITE POSTOP 4X6 (GAUZE/BANDAGES/DRESSINGS) ×1 IMPLANT
DURAPREP 26ML APPLICATOR (WOUND CARE) ×2 IMPLANT
ELECT COATED BLADE 2.86 ST (ELECTRODE) ×2 IMPLANT
ELECT PENCIL ROCKER SW 15FT (MISCELLANEOUS) ×2 IMPLANT
ELECT REM PT RETURN 9FT ADLT (ELECTROSURGICAL) ×2
ELECTRODE REM PT RTRN 9FT ADLT (ELECTROSURGICAL) ×1 IMPLANT
GLOVE SURG ENC MOIS LTX SZ6.5 (GLOVE) ×2 IMPLANT
GLOVE SURG MICRO LTX SZ8.5 (GLOVE) ×2 IMPLANT
GLOVE SURG UNDER POLY LF SZ6.5 (GLOVE) ×2 IMPLANT
GLOVE SURG UNDER POLY LF SZ8.5 (GLOVE) ×2 IMPLANT
GOWN STRL REUS W/ TWL LRG LVL3 (GOWN DISPOSABLE) ×2 IMPLANT
GOWN STRL REUS W/TWL 2XL LVL3 (GOWN DISPOSABLE) ×2 IMPLANT
GOWN STRL REUS W/TWL LRG LVL3 (GOWN DISPOSABLE) ×4
KIT BASIN OR (CUSTOM PROCEDURE TRAY) ×2 IMPLANT
KIT TURNOVER KIT B (KITS) ×2 IMPLANT
NDL SPNL 18GX3.5 QUINCKE PK (NEEDLE) ×1 IMPLANT
NEEDLE SPNL 18GX3.5 QUINCKE PK (NEEDLE) ×2 IMPLANT
NS IRRIG 1000ML POUR BTL (IV SOLUTION) ×2 IMPLANT
PACK ORTHO CERVICAL (CUSTOM PROCEDURE TRAY) ×2 IMPLANT
PACK UNIVERSAL I (CUSTOM PROCEDURE TRAY) ×2 IMPLANT
PAD ARMBOARD 7.5X6 YLW CONV (MISCELLANEOUS) ×4 IMPLANT
POSITIONER HEAD DONUT 9IN (MISCELLANEOUS) ×2 IMPLANT
RESTRAINT LIMB HOLDER UNIV (RESTRAINTS) ×2 IMPLANT
SPONGE INTESTINAL PEANUT (DISPOSABLE) ×2 IMPLANT
SPONGE SURGIFOAM ABS GEL SZ50 (HEMOSTASIS) ×2 IMPLANT
SPONGE T-LAP 4X18 ~~LOC~~+RFID (SPONGE) ×2 IMPLANT
STRIP CLOSURE SKIN 1/2X4 (GAUZE/BANDAGES/DRESSINGS) ×1 IMPLANT
SURGIFLO W/THROMBIN 8M KIT (HEMOSTASIS) ×2 IMPLANT
SUT BONE WAX W31G (SUTURE) ×2 IMPLANT
SUT MNCRL AB 3-0 PS2 27 (SUTURE) ×2 IMPLANT
SUT SILK 3 0 TIES 17X18 (SUTURE) ×2
SUT SILK 3-0 18XBRD TIE BLK (SUTURE) IMPLANT
SUT VIC AB 2-0 CT1 18 (SUTURE) ×2 IMPLANT
SYR BULB IRRIG 60ML STRL (SYRINGE) ×2 IMPLANT
SYR CONTROL 10ML LL (SYRINGE) ×1 IMPLANT
TAPE CLOTH 4X10 WHT NS (GAUZE/BANDAGES/DRESSINGS) ×2 IMPLANT
TAPE UMBILICAL COTTON 1/8X30 (MISCELLANEOUS) ×2 IMPLANT
TOWEL GREEN STERILE (TOWEL DISPOSABLE) ×2 IMPLANT
TOWEL GREEN STERILE FF (TOWEL DISPOSABLE) ×2 IMPLANT
WATER STERILE IRR 1000ML POUR (IV SOLUTION) ×2 IMPLANT

## 2021-01-18 NOTE — H&P (Deleted)
  The note originally documented on this encounter has been moved the the encounter in which it belongs.  

## 2021-01-18 NOTE — Anesthesia Postprocedure Evaluation (Signed)
Anesthesia Post Note  Patient: Rebecca Simon. O'Neil  Procedure(s) Performed: CERVICAL ANTERIOR DISC ARTHROPLASTY C6-7 (Neck)     Patient location during evaluation: PACU Anesthesia Type: General Level of consciousness: awake and alert Pain management: pain level controlled Vital Signs Assessment: post-procedure vital signs reviewed and stable Respiratory status: spontaneous breathing, nonlabored ventilation, respiratory function stable and patient connected to nasal cannula oxygen Cardiovascular status: blood pressure returned to baseline and stable Postop Assessment: no apparent nausea or vomiting Anesthetic complications: no   No notable events documented.  Last Vitals:  Vitals:   01/18/21 1405 01/18/21 1430  BP: 124/70 122/65  Pulse:  86  Resp:    Temp:    SpO2:  95%    Last Pain:  Vitals:   01/18/21 1320  TempSrc:   PainSc: 2                  Kennieth Rad

## 2021-01-18 NOTE — Anesthesia Procedure Notes (Signed)
Procedure Name: Intubation Date/Time: 01/18/2021 10:33 AM Performed by: Jonna Munro, CRNA Pre-anesthesia Checklist: Patient identified, Emergency Drugs available, Suction available, Patient being monitored and Timeout performed Patient Re-evaluated:Patient Re-evaluated prior to induction Oxygen Delivery Method: Circle system utilized Preoxygenation: Pre-oxygenation with 100% oxygen Induction Type: IV induction Ventilation: Mask ventilation without difficulty Laryngoscope Size: Mac and 3 Grade View: Grade I Tube type: Oral Tube size: 7.0 mm Number of attempts: 1 Airway Equipment and Method: Stylet Placement Confirmation: ETT inserted through vocal cords under direct vision, positive ETCO2 and breath sounds checked- equal and bilateral Secured at: 21 cm Tube secured with: Tape Dental Injury: Teeth and Oropharynx as per pre-operative assessment
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
## 2021-01-18 NOTE — Brief Op Note (Signed)
01/18/2021  1:05 PM  PATIENT:  Rebecca Simon  45 y.o. female  PRE-OPERATIVE DIAGNOSIS:  Cervical herniated disc with radiculopathy  POST-OPERATIVE DIAGNOSIS:  Cervical herniated disc with radiculopathy  PROCEDURE:  Procedure(s): CERVICAL ANTERIOR DISC ARTHROPLASTY C6-7 (N/A)  SURGEON:  Surgeon(s) and Role:    Venita Lick, MD - Primary  PHYSICIAN ASSISTANT:   ASSISTANTS: Voncille Lo, PA   ANESTHESIA:   general  EBL:  30 mL   BLOOD ADMINISTERED:none  DRAINS: none   LOCAL MEDICATIONS USED:  MARCAINE     SPECIMEN:  No Specimen  DISPOSITION OF SPECIMEN:  N/A  COUNTS:  YES  TOURNIQUET:  * No tourniquets in log *  DICTATION: .Dragon Dictation  PLAN OF CARE: Discharge to home after PACU  PATIENT DISPOSITION:  PACU - hemodynamically stable.

## 2021-01-18 NOTE — Op Note (Signed)
OPERATIVE REPORT  DATE OF SURGERY: 01/18/2021  PATIENT NAME:  Rebecca Simon MRN: [id number] DOB: 12-14-1975  PCP: Wynn Banker, MD  PRE-OPERATIVE DIAGNOSIS: Right C6-7 disc herniation with C7 radiculopathy  POST-OPERATIVE DIAGNOSIS: Same  PROCEDURE:   Total disc arthroplasty C6-7  SURGEON:  Venita Lick, MD  PHYSICIAN ASSISTANT: Voncille Lo, PA  ANESTHESIA:   General  EBL: 30 ml   Complications: None  Implants: Medtronic Prestige cervical disc arthroplasty.  5 x 14 mm.  BRIEF HISTORY: Rebecca Simon is a 45 y.o. female who presented my office with severe neck and radicular arm pain.  Imaging studies confirmed a large posterior lateral to the right C6-7 disc herniation with marked C7 nerve root compression.  Patient has failed to improve with conservative management and so he elected to proceed with surgery.  All appropriate risks, benefits, alternatives were discussed with patient and consent was obtained  PROCEDURE DETAILS: Patient was brought into the operating room and was properly positioned on the operating room table.  After induction with general anesthesia the patient was endotracheally intubated.  A timeout was taken to confirm all important data: including patient, procedure, and the level. Teds, SCD's were applied.   The anterior cervical spine was prepped and draped in a standard fashion.  Using lateral fluoroscopy identified the C6-7 disc space and marked out a transverse incision on the left side.  This was infiltrated with quarter percent Marcaine with epinephrine.  A transverse incision was made starting the midline proceeding to the left.  Sharp dissection was carried out down to and through the platysma.  I sharply dissected along the medial border of the sternocleidomastoid in the avascular plane.  The omohyoid muscle was identified and sacrificed for improved visualization.  I swept the trachea and esophagus to the right palpated and protected the  carotid sheath laterally.  I placed a needle into the C5-6-7 disc space and took an intraoperative lateral fluoroscopy view.  I confirmed that we are at the appropriate level.  The disc space was marked with the Bovie.  I mobilized the longus coli muscle from the superior aspect of C6 to the inferior aspect of C7 bilaterally.  I then placed my Caspar retracting blades underneath the longus coli muscle deflated the endotracheal cuff and expanded the retractor to the appropriate width and the endotracheal cuff was then reinflated.  15 blade scalpel was used to perform an annulotomy of C6-7 and I remove the bulk of the disc material pituitary rongeurs.  Using AP fluoroscopy identified the midline and placed my to distraction pins 1 in the body of C6 the other into C7 in the midline.  Then distracted the intervertebral space and maintained his distraction with the distraction pin set.  I trimmed down the leading edge of the osteophyte from C6 and this provided better visualization of the disc space.  I used my curettes to remove the remaining disc space until I was down the posterior annulus.  I trimmed down the posterior splint from C6 and C7 with a 1 mm Kerrison rongeur.  I then used my nerve hook to sweep in the posterior lateral corner.   I was able to remove and remove multiple large fragments of disc material.  I then undercut the uncovertebral joint to aid in the decompression of the nerve root.  Ultimately was able to get my nerve hook underneath the posterior longitudinal ligament and resect the ligament.  I could now freely sweep my nerve hook underneath the  vertebral body of C6 and C7 and under the uncovertebral joints bilaterally.  I continued to sweep out into the right lateral corner and I removed 3 more fragments of disc material.  In all I removed a very large amount of disc herniation material which was consistent with what was seen on the preoperative MRI.  At this point I used the trial  intervertebral spacers and elected to use a 15 x 14 implant.  The endplates were prepped with the manufacture standards and then I irrigated the wound copiously with normal saline.  The the disc arthroplasty was then [facility] to the appropriate I confirmed satisfactory position in both the AP and lateral planes.  The distraction pins were removed and the bleeding bone was sealed with bone wax.  I used Floseal and bipolar electrocautery to make sure that hemostasis.  After final irrigation the trach and esophagus were returned to midline.  Under live fluoroscopy I mobilized the disc space confirming an satisfactory flexion extension of the implant.  The platysma was then closed with interrupted 2-0 Vicryl suture, and the skin with 3-0 Monocryl.  Steri-Strips and a dry dressing were applied as was a collar.  Patient was ultimately extubated and transferred to the PACU without incident.  The end of the case all needle and sponge counts were correct.  There were no adverse intraoperative events.  Venita Lick, MD 01/18/2021 12:57 PM

## 2021-01-18 NOTE — Anesthesia Preprocedure Evaluation (Signed)
Anesthesia Evaluation  Patient identified by MRN, date of birth, ID band Patient awake    Reviewed: Allergy & Precautions, NPO status , Patient's Chart, lab work & pertinent test results  Airway Mallampati: III  TM Distance: >3 FB Neck ROM: Full    Dental no notable dental hx.    Pulmonary asthma , former smoker,    Pulmonary exam normal breath sounds clear to auscultation       Cardiovascular negative cardio ROS Normal cardiovascular exam Rhythm:Regular Rate:Normal     Neuro/Psych Vertigo negative psych ROS   GI/Hepatic negative GI ROS, Neg liver ROS,   Endo/Other  negative endocrine ROS  Renal/GU negative Renal ROS     Musculoskeletal  (+) Arthritis ,   Abdominal (+) + obese,   Peds  Hematology negative hematology ROS (+)   Anesthesia Other Findings Cervical herniated disc with radiculopathy  Reproductive/Obstetrics hcg negative                             Anesthesia Physical Anesthesia Plan  ASA: 2  Anesthesia Plan: General   Post-op Pain Management:    Induction: Intravenous  PONV Risk Score and Plan: Ondansetron, Dexamethasone, Midazolam and Treatment may vary due to age or medical condition  Airway Management Planned: Oral ETT  Additional Equipment:   Intra-op Plan:   Post-operative Plan: Extubation in OR  Informed Consent: I have reviewed the patients History and Physical, chart, labs and discussed the procedure including the risks, benefits and alternatives for the proposed anesthesia with the patient or authorized representative who has indicated his/her understanding and acceptance.     Dental advisory given  Plan Discussed with: CRNA  Anesthesia Plan Comments:         Anesthesia Quick Evaluation

## 2021-01-18 NOTE — Transfer of Care (Signed)
Immediate Anesthesia Transfer of Care Note  Patient: Rebecca Simon  Procedure(s) Performed: CERVICAL ANTERIOR DISC ARTHROPLASTY C6-7 (Neck)  Patient Location: PACU  Anesthesia Type:General  Level of Consciousness: awake, alert , oriented and patient cooperative  Airway & Oxygen Therapy: Patient Spontanous Breathing and Patient connected to face mask oxygen  Post-op Assessment: Report given to RN, Post -op Vital signs reviewed and stable and Patient moving all extremities X 4  Post vital signs: Reviewed and stable  Last Vitals:  Vitals Value Taken Time  BP 118/82 01/18/21 1320  Temp    Pulse 102 01/18/21 1326  Resp 19 01/18/21 1326  SpO2 91 % 01/18/21 1326  Vitals shown include unvalidated device data.  Last Pain:  Vitals:   01/18/21 0906  TempSrc: Oral  PainSc: 0-No pain         Complications: No notable events documented.

## 2021-01-18 NOTE — Discharge Instructions (Signed)

## 2021-01-20 ENCOUNTER — Encounter (HOSPITAL_COMMUNITY): Payer: Self-pay | Admitting: Orthopedic Surgery

## 2021-02-06 ENCOUNTER — Ambulatory Visit (INDEPENDENT_AMBULATORY_CARE_PROVIDER_SITE_OTHER): Payer: 59 | Admitting: *Deleted

## 2021-02-06 DIAGNOSIS — J309 Allergic rhinitis, unspecified: Secondary | ICD-10-CM

## 2021-02-20 ENCOUNTER — Ambulatory Visit (INDEPENDENT_AMBULATORY_CARE_PROVIDER_SITE_OTHER): Payer: 59 | Admitting: *Deleted

## 2021-02-20 DIAGNOSIS — J309 Allergic rhinitis, unspecified: Secondary | ICD-10-CM | POA: Diagnosis not present

## 2021-02-22 ENCOUNTER — Ambulatory Visit: Payer: 59 | Admitting: Allergy

## 2021-02-26 ENCOUNTER — Other Ambulatory Visit (INDEPENDENT_AMBULATORY_CARE_PROVIDER_SITE_OTHER): Payer: 59

## 2021-02-26 ENCOUNTER — Other Ambulatory Visit: Payer: Self-pay

## 2021-02-26 ENCOUNTER — Encounter: Payer: Self-pay | Admitting: Internal Medicine

## 2021-02-26 ENCOUNTER — Ambulatory Visit (INDEPENDENT_AMBULATORY_CARE_PROVIDER_SITE_OTHER): Payer: 59 | Admitting: Internal Medicine

## 2021-02-26 VITALS — BP 122/74 | HR 70 | Ht 61.0 in | Wt 200.0 lb

## 2021-02-26 DIAGNOSIS — R768 Other specified abnormal immunological findings in serum: Secondary | ICD-10-CM

## 2021-02-26 DIAGNOSIS — E669 Obesity, unspecified: Secondary | ICD-10-CM | POA: Insufficient documentation

## 2021-02-26 DIAGNOSIS — R7689 Other specified abnormal immunological findings in serum: Secondary | ICD-10-CM | POA: Insufficient documentation

## 2021-02-26 LAB — TSH: TSH: 0.76 u[IU]/mL (ref 0.35–5.50)

## 2021-02-26 LAB — T4, FREE: Free T4: 1.21 ng/dL (ref 0.60–1.60)
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
## 2021-02-26 NOTE — Patient Instructions (Signed)
-   Avoid sugar-sweetened beverages  - Avoid snacks if possible  -Choose healthy, lower carb lower calorie snacks: toss salad, cooked vegetables, cottage cheese, peanut butter, low fat cheese / string cheese, lower sodium deli meat, tuna salad or chicken salad   - Exercise : Brisk walking 175 minutes a week

## 2021-02-27 LAB — THYROID PEROXIDASE ANTIBODY: Thyroperoxidase Ab SerPl-aCnc: 2 IU/mL (ref <9)

## 2021-03-06 ENCOUNTER — Ambulatory Visit (INDEPENDENT_AMBULATORY_CARE_PROVIDER_SITE_OTHER): Payer: 59

## 2021-03-06 DIAGNOSIS — J309 Allergic rhinitis, unspecified: Secondary | ICD-10-CM

## 2021-03-22 ENCOUNTER — Ambulatory Visit (INDEPENDENT_AMBULATORY_CARE_PROVIDER_SITE_OTHER): Payer: 59 | Admitting: Allergy

## 2021-03-22 ENCOUNTER — Encounter: Payer: Self-pay | Admitting: Allergy

## 2021-03-22 ENCOUNTER — Other Ambulatory Visit: Payer: Self-pay

## 2021-03-22 ENCOUNTER — Ambulatory Visit: Payer: Self-pay

## 2021-03-22 VITALS — BP 118/68 | HR 85 | Temp 97.8°F | Resp 16 | Ht 61.0 in | Wt 202.8 lb

## 2021-03-22 DIAGNOSIS — J4540 Moderate persistent asthma, uncomplicated: Secondary | ICD-10-CM

## 2021-03-22 DIAGNOSIS — J309 Allergic rhinitis, unspecified: Secondary | ICD-10-CM

## 2021-03-22 DIAGNOSIS — J3089 Other allergic rhinitis: Secondary | ICD-10-CM

## 2021-03-22 DIAGNOSIS — T781XXD Other adverse food reactions, not elsewhere classified, subsequent encounter: Secondary | ICD-10-CM

## 2021-03-22 DIAGNOSIS — H1013 Acute atopic conjunctivitis, bilateral: Secondary | ICD-10-CM

## 2021-03-22 MED ORDER — PROAIR RESPICLICK 108 (90 BASE) MCG/ACT IN AEPB: 2.0000 | INHALATION_SPRAY | RESPIRATORY_TRACT | 1 refills | Status: DC | PRN

## 2021-03-22 MED ORDER — FLUTICASONE PROPIONATE 50 MCG/ACT NA SUSP: 2.0000 | Freq: Every day | NASAL | 5 refills | Status: DC

## 2021-03-22 MED ORDER — AIRDUO DIGIHALER 232-14 MCG/ACT IN AEPB: 1.0000 | INHALATION_SPRAY | Freq: Two times a day (BID) | RESPIRATORY_TRACT | 5 refills | Status: DC

## 2021-03-22 MED ORDER — AZELASTINE HCL 0.1 % NA SOLN: 2.0000 | Freq: Two times a day (BID) | NASAL | 5 refills | Status: DC

## 2021-03-22 NOTE — Patient Instructions (Addendum)
Allergies  - continue avoidance measures for trees, weeds, grasses, molds, dust mites, cockroach  - use Astelin (nasal antihistamine) 2 sprays each nostril twice a day  (use especially at night) for drainage control  - use Flonase 2 sprays each nostril daily for 1-2 weeks at a time before stopping once nasal congestion improves for maximum benefit  - continue your antihistamine supplement.  If becomes less effective can take Xyzal (long-acting antihistamine)  - for itchy/watery/red eyes use Pazeo or Pataday 1 drop each eye daily as needed  - continue allergen immunotherapy per schedule.  At maintenance dosing!  Reactive airway - change Symbicort to AirDuo 1 puff twice a day.  Sample provided - have access to albuterol inhaler 2 puffs every 4-6 hours as needed for cough/wheeze/shortness of breath/chest tightness.  May use 15-20 minutes prior to activity.   Monitor frequency of use.    Control goals:  Full participation in all desired activities (may need albuterol before activity) Albuterol use two time or less a week on average (not counting use with activity) Cough interfering with sleep two time or less a month Oral steroids no more than once a year No hospitalizations  Pollen food allergy syndrome  - skin testing at initial visit for select foods was positive to carrots and negative for apples and peaches  - The oral allergy syndrome (OAS) or pollen-food allergy syndrome (PFAS) is a relatively common form of food allergy, particularly in adults. It typically occurs in people who have pollen allergies when the immune system "sees" proteins on the food that look like proteins on the pollen. This results in the allergy antibody (IgE) binding to the food instead of the pollen. Patients typically report itching and/or mild swelling of the mouth and throat immediately following ingestion of certain uncooked fruits (including nuts) or raw vegetables. Only a very small number of affected individuals  experience systemic allergic reactions, such as anaphylaxis which occurs with true food allergies.        Follow-up 4-6 months or sooner if needed

## 2021-03-22 NOTE — Progress Notes (Signed)
Follow-up Note  RE: Rebecca Simon. Rebecca Simon MRN: [id number] DOB: 29-Jun-1975 Date of Office Visit: 03/22/2021   History of present illness: Rebecca Simon is a 45 y.o. female presenting today for follow-up of allergic rhinitis with conjunctivitis, reactive airway and pelvic with allergy syndrome.  Patient was last seen in the office on 09/21/2020 by myself. She is in PT for neck surgery from Oct 6 where she had cervical disk repair.  She was having numbness in her fingers and tingling and nerve pain in arm and shoulder.  This has been relieved with this surgery.  She is having more asthma symptoms at night primarily.  She states she has "gained a lot of weight" since June as she was not able to be active.   Since the surgery and in PT she is now able to be more active.   With activity now she is feeling more shortness of breath.  She is also feeling a tickle in her throat like drainage and this may cause her to cough.  She is taking symbicort 160cmg 1 puff in AM and 2 puffs in PM.  She is currently taking Astelin in the mornings.  She is using Flonase daily but at this time states she is not having any congestion.  She does continue taking her antihistamine supplement and does feel like it is helpful.  She does avoid certain foods that cause oral symptoms.  Review of systems: Review of Systems  Constitutional: Negative.   HENT: Negative.    Eyes: Negative.   Respiratory:  Positive for cough and shortness of breath.   Cardiovascular: Negative.   Gastrointestinal: Negative.   Musculoskeletal: Negative.   Skin: Negative.   Allergic/Immunologic: Negative.   Neurological: Negative.     All other systems negative unless noted above in HPI  Past medical/social/surgical/family history have been reviewed and are unchanged unless specifically indicated below.  No changes  Medication List: Current Outpatient Medications  Medication Sig Dispense Refill   azelastine (ASTELIN) 0.1 % nasal spray Place  2 sprays into both nostrils 2 (two) times daily. Use in each nostril as directed 30 mL 5   diclofenac (VOLTAREN) 75 MG EC tablet Take 75 mg by mouth daily.     Fluticasone-Salmeterol,sensor, (AIRDUO DIGIHALER) 232-14 MCG/ACT AEPB Inhale 1 puff into the lungs in the morning and at bedtime. 1 each 5   gabapentin (NEURONTIN) 300 MG capsule Take 300mg  in morning, afternoon, and 2 capsules (600mg ) in the evening 100 capsule 3   Norethindrone-Ethinyl Estradiol-Fe Biphas (LO LOESTRIN FE) 1 MG-10 MCG / 10 MCG tablet Take 1 tablet by mouth every evening.     Albuterol Sulfate (PROAIR RESPICLICK) 108 (90 Base) MCG/ACT AEPB Inhale 2 puffs into the lungs every 4 (four) hours as needed. 2 each 1   fluticasone (FLONASE) 50 MCG/ACT nasal spray Place 2 sprays into both nostrils daily. 16 g 5   No current facility-administered medications for this visit.     Known medication allergies: Allergies  Allergen Reactions   Sulfa Antibiotics Anaphylaxis and Swelling   Vicodin [Hydrocodone-Acetaminophen] Other (See Comments)    Nightmares, visual disturbances     Physical examination: Blood pressure 118/68, pulse 85, temperature 97.8 F (36.6 C), resp. rate 16, height 5\' 1"  (1.549 m), weight 202 lb 12.8 oz (92 kg), SpO2 100 %.  General: Alert, interactive, in no acute distress. HEENT: PERRLA, TMs pearly gray, turbinates non-edematous without discharge, post-pharynx non erythematous. Neck: Supple without lymphadenopathy. Lungs: Clear to auscultation without wheezing,  rhonchi or rales. {no increased work of breathing. CV: Normal S1, S2 without murmurs. Abdomen: Nondistended, nontender. Skin: Warm and dry, without lesions or rashes. Extremities:  No clubbing, cyanosis or edema. Neuro:   Grossly intact.  Diagnositics/Labs:  Spirometry: FEV1: 1.87L 84%:, FVC: 2.24L 82%, ratio consistent with nonobstructive pattern  Assessment and plan: Allergic rhinitis with conjunctivitis  - continue avoidance measures  for trees, weeds, grasses, molds, dust mites, cockroach  - use Astelin (nasal antihistamine) 2 sprays each nostril twice a day  (use especially at night) for drainage control  - use Flonase 2 sprays each nostril daily for 1-2 weeks at a time before stopping once nasal congestion improves for maximum benefit  - continue your antihistamine supplement.  If becomes less effective can take Xyzal (long-acting antihistamine)  - for itchy/watery/red eyes use Pazeo or Pataday 1 drop each eye daily as needed  - continue allergen immunotherapy per schedule.  At maintenance dosing!  Reactive airway - change Symbicort to AirDuo 1 puff twice a day.  Sample provided - have access to albuterol inhaler 2 puffs every 4-6 hours as needed for cough/wheeze/shortness of breath/chest tightness.  May use 15-20 minutes prior to activity.   Monitor frequency of use.    Control goals:  Full participation in all desired activities (may need albuterol before activity) Albuterol use two time or less a week on average (not counting use with activity) Cough interfering with sleep two time or less a month Oral steroids no more than once a year No hospitalizations  Pollen food allergy syndrome  - skin testing at initial visit for select foods was positive to carrots and negative for apples and peaches  - The oral allergy syndrome (OAS) or pollen-food allergy syndrome (PFAS) is a relatively common form of food allergy, particularly in adults. It typically occurs in people who have pollen allergies when the immune system "sees" proteins on the food that look like proteins on the pollen. This results in the allergy antibody (IgE) binding to the food instead of the pollen. Patients typically report itching and/or mild swelling of the mouth and throat immediately following ingestion of certain uncooked fruits (including nuts) or raw vegetables. Only a very small number of affected individuals experience systemic allergic reactions,  such as anaphylaxis which occurs with true food allergies.    Follow-up 4-6 months or sooner if needed  I appreciate the opportunity to take part in Rebecca Simon's care. Please do not hesitate to contact me with questions.  Sincerely,   Margo Aye, MD Allergy/Immunology [facility]

## 2021-03-27 NOTE — Progress Notes (Signed)
EXP 03/29/22 °

## 2021-03-29 DIAGNOSIS — J3081 Allergic rhinitis due to animal (cat) (dog) hair and dander: Secondary | ICD-10-CM

## 2021-04-05 ENCOUNTER — Ambulatory Visit (INDEPENDENT_AMBULATORY_CARE_PROVIDER_SITE_OTHER): Payer: 59

## 2021-04-05 DIAGNOSIS — J309 Allergic rhinitis, unspecified: Secondary | ICD-10-CM

## 2021-04-17 ENCOUNTER — Ambulatory Visit (INDEPENDENT_AMBULATORY_CARE_PROVIDER_SITE_OTHER): Payer: Self-pay | Admitting: *Deleted

## 2021-04-17 DIAGNOSIS — J309 Allergic rhinitis, unspecified: Secondary | ICD-10-CM

## 2021-05-02 ENCOUNTER — Ambulatory Visit (INDEPENDENT_AMBULATORY_CARE_PROVIDER_SITE_OTHER): Payer: 59

## 2021-05-02 DIAGNOSIS — J309 Allergic rhinitis, unspecified: Secondary | ICD-10-CM

## 2021-05-11 ENCOUNTER — Ambulatory Visit (INDEPENDENT_AMBULATORY_CARE_PROVIDER_SITE_OTHER): Payer: 59

## 2021-05-11 DIAGNOSIS — J309 Allergic rhinitis, unspecified: Secondary | ICD-10-CM | POA: Diagnosis not present

## 2021-05-18 ENCOUNTER — Ambulatory Visit (INDEPENDENT_AMBULATORY_CARE_PROVIDER_SITE_OTHER): Payer: 59 | Admitting: *Deleted

## 2021-05-18 DIAGNOSIS — J309 Allergic rhinitis, unspecified: Secondary | ICD-10-CM

## 2021-05-24 ENCOUNTER — Ambulatory Visit (INDEPENDENT_AMBULATORY_CARE_PROVIDER_SITE_OTHER): Payer: 59

## 2021-05-24 DIAGNOSIS — J309 Allergic rhinitis, unspecified: Secondary | ICD-10-CM

## 2021-05-30 ENCOUNTER — Ambulatory Visit: Payer: 59 | Admitting: Family Medicine

## 2021-06-01 ENCOUNTER — Ambulatory Visit (INDEPENDENT_AMBULATORY_CARE_PROVIDER_SITE_OTHER): Payer: 59 | Admitting: Family Medicine

## 2021-06-01 ENCOUNTER — Encounter: Payer: Self-pay | Admitting: Family Medicine

## 2021-06-01 ENCOUNTER — Ambulatory Visit (INDEPENDENT_AMBULATORY_CARE_PROVIDER_SITE_OTHER): Payer: 59

## 2021-06-01 VITALS — BP 122/78 | HR 77 | Temp 98.2°F | Ht 61.0 in | Wt 204.6 lb

## 2021-06-01 DIAGNOSIS — R0683 Snoring: Secondary | ICD-10-CM | POA: Diagnosis not present

## 2021-06-01 DIAGNOSIS — E6609 Other obesity due to excess calories: Secondary | ICD-10-CM | POA: Diagnosis not present

## 2021-06-01 DIAGNOSIS — Z6838 Body mass index (BMI) 38.0-38.9, adult: Secondary | ICD-10-CM

## 2021-06-01 DIAGNOSIS — J309 Allergic rhinitis, unspecified: Secondary | ICD-10-CM

## 2021-06-01 MED ORDER — PHENTERMINE HCL 37.5 MG PO TABS: 37.5000 mg | ORAL_TABLET | Freq: Every day | ORAL | 0 refills | Status: DC

## 2021-06-01 NOTE — Patient Instructions (Signed)
Start with 1/2 tablet of the phentermine in the morning to help with appetite, weight loss. Work on getting back on track with regular exercise.

## 2021-06-01 NOTE — Progress Notes (Signed)
Rebecca Simon DOB: 12-20-1975 Encounter date: 06/01/2021  This is a 46 y.o. female who presents with Chief Complaint  Patient presents with   Follow-up    History of present illness: Worried about weight gain. Just can't get weight off. Has tried changing diet. Hasn't gotten back to exercise routine. She is just four months out from surgery. Has tried following some nutrition plans. Has a hard time with eating breakfast. When she did try eating healthy, cutting out sugars/fried foods,   Still some soreness back of right shoulder; light nerve pain right upper arm. Getting feeling back in fingers.   She did fall a couple of weeks ago. On edge of grass and driveway. No major injury.   Still working with allergist for congestion/cough/allergies. Worse through day. Has changed inhaler airduo.   Does snore. Hasn't had sleep eval. Sleep up and down in tracker through watch.     Allergies  Allergen Reactions   Sulfa Antibiotics Anaphylaxis and Swelling   Vicodin [Hydrocodone-Acetaminophen] Other (See Comments)    Nightmares, visual disturbances   Current Meds  Medication Sig   Albuterol Sulfate (PROAIR RESPICLICK) 123XX123 (90 Base) MCG/ACT AEPB Inhale 2 puffs into the lungs every 4 (four) hours as needed.   azelastine (ASTELIN) 0.1 % nasal spray Place 2 sprays into both nostrils 2 (two) times daily. Use in each nostril as directed   fluticasone (FLONASE) 50 MCG/ACT nasal spray Place 2 sprays into both nostrils daily.   Fluticasone-Salmeterol,sensor, (AIRDUO DIGIHALER) 232-14 MCG/ACT AEPB Inhale 1 puff into the lungs in the morning and at bedtime.   Norethindrone-Ethinyl Estradiol-Fe Biphas (LO LOESTRIN FE) 1 MG-10 MCG / 10 MCG tablet Take 1 tablet by mouth every evening.   phentermine (ADIPEX-P) 37.5 MG tablet Take 1 tablet (37.5 mg total) by mouth daily before breakfast.   [DISCONTINUED] diclofenac (VOLTAREN) 75 MG EC tablet Take 75 mg by mouth daily.    Review of Systems   Constitutional:  Negative for chills, fatigue and fever.  Respiratory:  Negative for cough, chest tightness, shortness of breath and wheezing.   Cardiovascular:  Negative for chest pain, palpitations and leg swelling.   Objective:  BP 122/78 (BP Location: Left Arm, Patient Position: Sitting, Cuff Size: Large)    Pulse 77    Temp 98.2 F (36.8 C) (Oral)    Ht 5\' 1"  (1.549 m)    Wt 204 lb 9.6 oz (92.8 kg)    SpO2 99%    BMI 38.66 kg/m   Weight: 204 lb 9.6 oz (92.8 kg)   BP Readings from Last 3 Encounters:  06/01/21 122/78  03/22/21 118/68  02/26/21 122/74   Wt Readings from Last 3 Encounters:  06/01/21 204 lb 9.6 oz (92.8 kg)  03/22/21 202 lb 12.8 oz (92 kg)  02/26/21 200 lb (90.7 kg)    Physical Exam Constitutional:      General: She is not in acute distress.    Appearance: She is well-developed.  Cardiovascular:     Rate and Rhythm: Normal rate and regular rhythm.     Heart sounds: Normal heart sounds. No murmur heard.   No friction rub.  Pulmonary:     Effort: Pulmonary effort is normal. No respiratory distress.     Breath sounds: Normal breath sounds. No wheezing or rales.  Musculoskeletal:     Right lower leg: No edema.     Left lower leg: No edema.  Neurological:     Mental Status: She is alert and oriented to person,  place, and time.  Psychiatric:        Behavior: Behavior normal.    Assessment/Plan  1. Class 2 obesity due to excess calories without serious comorbidity with body mass index (BMI) of 38.0 to 38.9 in adult We discussed options for weight loss including healthy weight and wellness and medications.  We are going to have her try phentermine and work on getting back on track with regular exercise.  Follow-up for recheck of weight and discussion of progress in one months time. - Ambulatory referral to Sleep Studies  2. Snoring - Ambulatory referral to Sleep Studies  Return in about 1 month (around 06/29/2021) for Chronic condition visit.    31  minutes spent in discussion about weight loss options.  We discussed different weight loss dietary programs, we discussed healthy weight and wellness, we discussed referral for nutritionist.  We discussed different categories of medication including phentermine, Contrave, GLP-1's; exam, charting.    Micheline Rough, MD

## 2021-06-13 ENCOUNTER — Ambulatory Visit (INDEPENDENT_AMBULATORY_CARE_PROVIDER_SITE_OTHER): Payer: 59

## 2021-06-13 DIAGNOSIS — J309 Allergic rhinitis, unspecified: Secondary | ICD-10-CM | POA: Diagnosis not present

## 2021-06-21 ENCOUNTER — Encounter: Payer: Self-pay | Admitting: Family Medicine

## 2021-06-28 ENCOUNTER — Ambulatory Visit (INDEPENDENT_AMBULATORY_CARE_PROVIDER_SITE_OTHER): Payer: 59

## 2021-06-28 DIAGNOSIS — J309 Allergic rhinitis, unspecified: Secondary | ICD-10-CM | POA: Diagnosis not present

## 2021-06-29 ENCOUNTER — Ambulatory Visit: Payer: 59 | Admitting: Family Medicine

## 2021-07-09 ENCOUNTER — Ambulatory Visit (INDEPENDENT_AMBULATORY_CARE_PROVIDER_SITE_OTHER): Payer: 59

## 2021-07-09 DIAGNOSIS — J309 Allergic rhinitis, unspecified: Secondary | ICD-10-CM

## 2021-07-11 NOTE — Progress Notes (Signed)
VIALS EXP 07-12-22 ?

## 2021-07-12 DIAGNOSIS — J3081 Allergic rhinitis due to animal (cat) (dog) hair and dander: Secondary | ICD-10-CM | POA: Diagnosis not present

## 2021-07-17 ENCOUNTER — Ambulatory Visit: Payer: 59 | Admitting: Neurology

## 2021-07-17 ENCOUNTER — Encounter: Payer: Self-pay | Admitting: Neurology

## 2021-07-17 VITALS — BP 128/76 | HR 80 | Ht 61.0 in | Wt 198.0 lb

## 2021-07-17 DIAGNOSIS — G4719 Other hypersomnia: Secondary | ICD-10-CM | POA: Diagnosis not present

## 2021-07-17 DIAGNOSIS — G478 Other sleep disorders: Secondary | ICD-10-CM | POA: Diagnosis not present

## 2021-07-17 DIAGNOSIS — Z6837 Body mass index (BMI) 37.0-37.9, adult: Secondary | ICD-10-CM

## 2021-07-17 DIAGNOSIS — J0141 Acute recurrent pansinusitis: Secondary | ICD-10-CM

## 2021-07-17 DIAGNOSIS — J4520 Mild intermittent asthma, uncomplicated: Secondary | ICD-10-CM | POA: Diagnosis not present

## 2021-07-17 DIAGNOSIS — R0683 Snoring: Secondary | ICD-10-CM | POA: Insufficient documentation

## 2021-07-17 DIAGNOSIS — R519 Headache, unspecified: Secondary | ICD-10-CM
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
## 2021-07-17 NOTE — Patient Instructions (Signed)
Screening for Sleep Apnea °Sleep apnea is a condition in which breathing pauses or becomes shallow during sleep. Sleep apnea screening is a test to determine if you are at risk for sleep apnea. The test includes a series of questions. It will only takes a few minutes. Your health care provider may ask you to have this test in preparation for surgery or as part of a physical exam. °What are the symptoms of sleep apnea? °Common symptoms of sleep apnea include: °Snoring. °Waking up often at night. °Daytime sleepiness. °Pauses in breathing. °Choking or gasping during sleep. °Irritability. °Forgetfulness. °Trouble thinking clearly. °Depression. °Personality changes. °Most people with sleep apnea do not know that they have it. °What are the advantages of sleep apnea screening? °Getting screened for sleep apnea can help: °Ensure your safety. It is important for your health care providers to know whether or not you have sleep apnea, especially if you are having surgery or have other long-term (chronic) health conditions. °Improve your health and allow you to get a better night's rest. Restful sleep can help you: °Have more energy. °Lose weight. °Improve high blood pressure. °Improve diabetes management. °Prevent stroke. °Prevent car accidents. °What happens during the screening? °Screening usually includes being asked a list of questions about your sleep quality. Some questions you may be asked include: °Do you snore? °Is your sleep restless? °Do you have daytime sleepiness? °Has a partner or spouse told you that you stop breathing during sleep? °Have you had trouble concentrating or memory loss? °What is your age? °What is your neck circumference? °To measure your neck, keep your back straight and gently wrap the tape measure around your neck. Put the tape measure at the middle of your neck, between your chin and collarbone. °What is your sex assigned at birth? °Do you have or are you being treated for high blood  pressure? °If your screening test is positive, you are at risk for the condition. Further testing may be needed to confirm a diagnosis of sleep apnea. °Where to find more information °You can find screening tools online or at your health care clinic. For more information about sleep apnea screening and healthy sleep, visit these websites: °Centers for Disease Control and Prevention: www.cdc.gov °American Sleep Apnea Association: www.sleepapnea.org °Contact a health care provider if: °You think that you may have sleep apnea. °Summary °Sleep apnea screening can help determine if you are at risk for sleep apnea. °It is important for your health care providers to know whether or not you have sleep apnea, especially if you are having surgery or have other chronic health conditions. °You may be asked to take a screening test for sleep apnea in preparation for surgery or as part of a physical exam. °This information is not intended to replace advice given to you by your health care provider. Make sure you discuss any questions you have with your health care provider. °Document Revised: 03/10/2020 Document Reviewed: 03/10/2020 °Elsevier Patient Education © 2022 Elsevier Inc. °Sleep Apnea °Sleep apnea affects breathing during sleep. It causes breathing to stop for 10 seconds or more, or to become shallow. People with sleep apnea usually snore loudly. °It can also increase the risk of: °Heart attack. °Stroke. °Being very overweight (obese). °Diabetes. °Heart failure. °Irregular heartbeat. °High blood pressure. °The goal of treatment is to help you breathe normally again. °What are the causes? °The most common cause of this condition is a collapsed or blocked airway. °There are three kinds of sleep apnea: °Obstructive sleep apnea. This   is caused by a blocked or collapsed airway. °Central sleep apnea. This happens when the brain does not send the right signals to the muscles that control breathing. °Mixed sleep apnea. This is a  combination of obstructive and central sleep apnea. °What increases the risk? °Being overweight. °Smoking. °Having a small airway. °Being older. °Being female. °Drinking alcohol. °Taking medicines to calm yourself (sedatives or tranquilizers). °Having family members with the condition. °Having a tongue or tonsils that are larger than normal. °What are the signs or symptoms? °Trouble staying asleep. °Loud snoring. °Headaches in the morning. °Waking up gasping. °Dry mouth or sore throat in the morning. °Being sleepy or tired during the day. °If you are sleepy or tired during the day, you may also: °Not be able to focus your mind (concentrate). °Forget things. °Get angry a lot and have mood swings. °Feel sad (depressed). °Have changes in your personality. °Have less interest in sex, if you are female. °Be unable to have an erection, if you are female. °How is this treated? ° °Sleeping on your side. °Using a medicine to get rid of mucus in your nose (decongestant). °Avoiding the use of alcohol, medicines to help you relax, or certain pain medicines (narcotics). °Losing weight, if needed. °Changing your diet. °Quitting smoking. °Using a machine to open your airway while you sleep, such as: °An oral appliance. This is a mouthpiece that shifts your lower jaw forward. °A CPAP device. This device blows air through a mask when you breathe out (exhale). °An EPAP device. This has valves that you put in each nostril. °A BIPAP device. This device blows air through a mask when you breathe in (inhale) and breathe out. °Having surgery if other treatments do not work. °Follow these instructions at home: °Lifestyle °Make changes that your doctor recommends. °Eat a healthy diet. °Lose weight if needed. °Avoid alcohol, medicines to help you relax, and some pain medicines. °Do not smoke or use any products that contain nicotine or tobacco. If you need help quitting, ask your doctor. °General instructions °Take over-the-counter and  prescription medicines only as told by your doctor. °If you were given a machine to use while you sleep, use it only as told by your doctor. °If you are having surgery, make sure to tell your doctor you have sleep apnea. You may need to bring your device with you. °Keep all follow-up visits. °Contact a doctor if: °The machine that you were given to use during sleep bothers you or does not seem to be working. °You do not get better. °You get worse. °Get help right away if: °Your chest hurts. °You have trouble breathing in enough air. °You have an uncomfortable feeling in your back, arms, or stomach. °You have trouble talking. °One side of your body feels weak. °A part of your face is hanging down. °These symptoms may be an emergency. Get help right away. Call your local emergency services (911 in the [place] °Do not wait to see if the symptoms will go away. °Do not drive yourself to the hospital. °Summary °This condition affects breathing during sleep. °The most common cause is a collapsed or blocked airway. °The goal of treatment is to help you breathe normally while you sleep. °This information is not intended to replace advice given to you by your health care provider. Make sure you discuss any questions you have with your health care provider. °Document Revised: 11/08/2020 Document Reviewed: 03/10/2020 °Elsevier Patient Education © 2022 Elsevier Inc. ° °

## 2021-07-23 ENCOUNTER — Ambulatory Visit (INDEPENDENT_AMBULATORY_CARE_PROVIDER_SITE_OTHER): Payer: 59

## 2021-07-23 DIAGNOSIS — J309 Allergic rhinitis, unspecified: Secondary | ICD-10-CM

## 2021-08-06 ENCOUNTER — Ambulatory Visit (INDEPENDENT_AMBULATORY_CARE_PROVIDER_SITE_OTHER): Payer: 59 | Admitting: Family Medicine

## 2021-08-06 ENCOUNTER — Encounter: Payer: Self-pay | Admitting: Family Medicine

## 2021-08-06 VITALS — BP 98/60 | HR 75 | Temp 98.5°F | Ht 61.0 in | Wt 197.8 lb

## 2021-08-06 DIAGNOSIS — E669 Obesity, unspecified: Secondary | ICD-10-CM | POA: Diagnosis not present

## 2021-08-06 MED ORDER — PHENTERMINE HCL 8 MG PO TABS: 8.0000 mg | ORAL_TABLET | Freq: Every day | ORAL | 2 refills | Status: DC

## 2021-08-06 NOTE — Patient Instructions (Signed)
Stop the phentermine 37.5 and try the 8mg .increase to 2 tablets of 8mg (16mg  total) if tolerating better. If not tolerating 8mg , then just stop. ?

## 2021-08-06 NOTE — Progress Notes (Signed)
?Rebecca Simon ?DOB: 16-Jan-1976 ?Encounter date: 08/06/2021 ? ?This is a 46 y.o. female who presents for chronic condition visit, weight loss follow up.  ? ?History of present illness: ?Last visit with me was 06/01/2021.  We discussed goal for weight loss at that time.  She was referred to healthy weight and wellness and we started phentermine.  She was also referred for sleep evaluation.  This is pending. GLP 1 not covered by insurance. ? ?She is still recovering from respiratory illness.  ? ?Phentermine was making her a little jittery and gave her a little slight headache with this, so hasn't taken regularly. Has been doing less sugar. Has tried to back off of fried foods. Doing more grilled foods and less carbs. Has done more salads. Has not been exercising.  ? ?Wheezing esp as she is getting later in day towards evening. She is doing antihistamine nasal spray and airduo now (new). She has done better with pollen this year since starting shots. Does take natural antihistamine daily.other antihistamines didn't really seem to help. Also was on singulair at one point but nothing really helped. ? ? ?Allergies  ?Allergen Reactions  ? Sulfa Antibiotics Anaphylaxis and Swelling  ? Vicodin [Hydrocodone-Acetaminophen] Other (See Comments)  ?  Nightmares, visual disturbances  ? ?Current Meds  ?Medication Sig  ? Albuterol Sulfate (PROAIR RESPICLICK) 108 (90 Base) MCG/ACT AEPB Inhale 2 puffs into the lungs every 4 (four) hours as needed.  ? azelastine (ASTELIN) 0.1 % nasal spray Place 2 sprays into both nostrils 2 (two) times daily. Use in each nostril as directed  ? fluticasone (FLONASE) 50 MCG/ACT nasal spray Place 2 sprays into both nostrils daily.  ? Fluticasone-Salmeterol,sensor, (AIRDUO DIGIHALER) 232-14 MCG/ACT AEPB Inhale 1 puff into the lungs in the morning and at bedtime.  ? Norethindrone-Ethinyl Estradiol-Fe Biphas (LO LOESTRIN FE) 1 MG-10 MCG / 10 MCG tablet Take 1 tablet by mouth every evening.  ? Phentermine  HCl 8 MG TABS Take 8-16 mg by mouth daily.  ? [DISCONTINUED] phentermine (ADIPEX-P) 37.5 MG tablet Take 1 tablet (37.5 mg total) by mouth daily before breakfast.  ? ? ?Review of Systems  ?Constitutional:  Negative for chills, fatigue and fever.  ?HENT:  Positive for congestion.   ?Respiratory:  Positive for cough, shortness of breath and wheezing. Negative for chest tightness.   ?Cardiovascular:  Negative for chest pain, palpitations and leg swelling.  ? ?Objective: ? ?BP 98/60 (BP Location: Left Arm, Patient Position: Sitting, Cuff Size: Large)   Pulse 75   Temp 98.5 ?F (36.9 ?C) (Oral)   Ht 5\' 1"  (1.549 m)   Wt 197 lb 12.8 oz (89.7 kg)   SpO2 98%   BMI 37.37 kg/m?   Weight: 197 lb 12.8 oz (89.7 kg)  ? ?BP Readings from Last 3 Encounters:  ?08/06/21 98/60  ?07/17/21 128/76  ?06/01/21 122/78  ? ?Wt Readings from Last 3 Encounters:  ?08/06/21 197 lb 12.8 oz (89.7 kg)  ?07/17/21 198 lb (89.8 kg)  ?06/01/21 204 lb 9.6 oz (92.8 kg)  ? ? ?Physical Exam ?Constitutional:   ?   General: She is not in acute distress. ?   Appearance: She is well-developed.  ?Cardiovascular:  ?   Rate and Rhythm: Normal rate and regular rhythm.  ?   Heart sounds: Normal heart sounds. No murmur heard. ?  No friction rub.  ?Pulmonary:  ?   Effort: Pulmonary effort is normal. No respiratory distress.  ?   Breath sounds: Normal breath sounds. No  wheezing or rales.  ?Musculoskeletal:  ?   Right lower leg: No edema.  ?   Left lower leg: No edema.  ?Neurological:  ?   Mental Status: She is alert and oriented to person, place, and time.  ?Psychiatric:     ?   Behavior: Behavior normal.  ? ? ?Assessment/Plan ?1. Obesity (BMI 35.0-39.9 without comorbidity) ?We are going to try a lower dose of the phentermine to see if she will tolerate this.  If not having success with weight loss in 1 month's time and has tried maximum tolerated dose at that time, we will stop medication.  She will continue to work on regular activity level and healthy eating. ?-  Phentermine HCl 8 MG TABS; Take 8-16 mg by mouth daily.  Dispense: 60 tablet; Refill: 2 ? ? ? ?Return in about 3 months (around 11/05/2021) for Chronic condition visit. ? ? ? ?Theodis Shove, MD ?

## 2021-08-07 ENCOUNTER — Ambulatory Visit (INDEPENDENT_AMBULATORY_CARE_PROVIDER_SITE_OTHER): Payer: 59

## 2021-08-07 DIAGNOSIS — J309 Allergic rhinitis, unspecified: Secondary | ICD-10-CM

## 2021-08-22 ENCOUNTER — Ambulatory Visit (INDEPENDENT_AMBULATORY_CARE_PROVIDER_SITE_OTHER): Payer: 59 | Admitting: Neurology

## 2021-08-22 DIAGNOSIS — G478 Other sleep disorders: Secondary | ICD-10-CM

## 2021-08-22 DIAGNOSIS — J0141 Acute recurrent pansinusitis: Secondary | ICD-10-CM

## 2021-08-22 DIAGNOSIS — R519 Headache, unspecified: Secondary | ICD-10-CM

## 2021-08-22 DIAGNOSIS — J4520 Mild intermittent asthma, uncomplicated: Secondary | ICD-10-CM

## 2021-08-22 DIAGNOSIS — R0683 Snoring: Secondary | ICD-10-CM

## 2021-08-22 DIAGNOSIS — G4733 Obstructive sleep apnea (adult) (pediatric): Secondary | ICD-10-CM

## 2021-08-22 DIAGNOSIS — G4719 Other hypersomnia: Secondary | ICD-10-CM

## 2021-08-23 ENCOUNTER — Encounter: Payer: Self-pay | Admitting: Allergy

## 2021-08-23 ENCOUNTER — Ambulatory Visit: Payer: 59 | Admitting: Allergy

## 2021-08-23 ENCOUNTER — Ambulatory Visit (INDEPENDENT_AMBULATORY_CARE_PROVIDER_SITE_OTHER): Payer: 59

## 2021-08-23 ENCOUNTER — Other Ambulatory Visit: Payer: Self-pay | Admitting: Allergy

## 2021-08-23 VITALS — BP 122/80 | Ht 61.0 in | Wt 197.0 lb

## 2021-08-23 DIAGNOSIS — H1013 Acute atopic conjunctivitis, bilateral: Secondary | ICD-10-CM | POA: Diagnosis not present

## 2021-08-23 DIAGNOSIS — J3089 Other allergic rhinitis: Secondary | ICD-10-CM

## 2021-08-23 DIAGNOSIS — T781XXD Other adverse food reactions, not elsewhere classified, subsequent encounter: Secondary | ICD-10-CM | POA: Diagnosis not present

## 2021-08-23 DIAGNOSIS — J309 Allergic rhinitis, unspecified: Secondary | ICD-10-CM

## 2021-08-23 DIAGNOSIS — J4540 Moderate persistent asthma, uncomplicated: Secondary | ICD-10-CM

## 2021-08-23 MED ORDER — PROAIR RESPICLICK 108 (90 BASE) MCG/ACT IN AEPB: 2.0000 | INHALATION_SPRAY | RESPIRATORY_TRACT | 1 refills | Status: DC | PRN

## 2021-08-23 MED ORDER — IPRATROPIUM BROMIDE 0.06 % NA SOLN: NASAL | 12 refills | Status: DC

## 2021-08-23 NOTE — Progress Notes (Signed)
? ? ?Follow-up Note ? ?RE: Rebecca Simon. [name] MRN: [id number] DOB: 01-04-1976 ?Date of Office Visit: 08/23/2021 ? ? ?History of present illness: ?Rebecca Simon is a 46 y.o. female presenting today for follow-up of allergic rhinitis with conjunctivitis, oral allergy syndrome, and reactive airway.  She was last seen in the office on 03/22/2021 by myself. ? ?She states pollen season has been much better than previous since she has been on immunotherapy.  However the cough is still there that does seem to be worse at night.  She does feel like there is a little bit more drainage at night that leads to the cough.  She is using Astelin 2 sprays twice a day and Flonase daily.  With her asthma she is on air duo 1 puff twice a day at this time she does feel this is more effective than the Symbicort was.  She may use her albuterol about once a week on average for symptom relief. ?With her immunotherapy she is at maintenance dosing and tolerating injections well.  She states she has been able to eat carrots, apples and peaches since she has been on immunotherapy and is not noting any symptoms with ingestion. ? ? ?Review of systems: ?Review of Systems  ?Constitutional: Negative.   ?HENT:  Positive for postnasal drip.   ?Eyes: Negative.   ?Respiratory:  Positive for cough.   ?Cardiovascular: Negative.   ?Gastrointestinal: Negative.   ?Musculoskeletal: Negative.   ?Skin: Negative.   ?Allergic/Immunologic: Negative.   ?Neurological: Negative.    ? ?All other systems negative unless noted above in HPI ? ?Past medical/social/surgical/family history have been reviewed and are unchanged unless specifically indicated below. ? ?No changes ? ?Medication List: ?Current Outpatient Medications  ?Medication Sig Dispense Refill  ? azelastine (ASTELIN) 0.1 % nasal spray Place 2 sprays into both nostrils 2 (two) times daily. Use in each nostril as directed 30 mL 5  ? fluticasone (FLONASE) 50 MCG/ACT nasal spray Place 2 sprays into both nostrils  daily. 16 g 5  ? Fluticasone-Salmeterol,sensor, (AIRDUO DIGIHALER) 232-14 MCG/ACT AEPB Inhale 1 puff into the lungs in the morning and at bedtime. 1 each 5  ? ipratropium (ATROVENT) 0.06 % nasal spray Place 2 sprays each nostril in evening/bedtime and can use up to 3-4 times a day if needed 15 mL 12  ? Norethindrone-Ethinyl Estradiol-Fe Biphas (LO LOESTRIN FE) 1 MG-10 MCG / 10 MCG tablet Take 1 tablet by mouth every evening.    ? Phentermine HCl 8 MG TABS Take 8-16 mg by mouth daily. 60 tablet 2  ? albuterol (VENTOLIN HFA) 108 (90 Base) MCG/ACT inhaler Inhale 2 puffs into the lungs every 4 (four) hours as needed for wheezing or shortness of breath. 8 g 1  ? ?No current facility-administered medications for this visit.  ?  ? ?Known medication allergies: ?Allergies  ?Allergen Reactions  ? Sulfa Antibiotics Anaphylaxis and Swelling  ? Vicodin [Hydrocodone-Acetaminophen] Other (See Comments)  ?  Nightmares, visual disturbances  ? ? ? ?Physical examination: ?Blood pressure 122/80, height 5\' 1"  (1.549 m), weight 197 lb (89.4 kg). ? ?General: Alert, interactive, in no acute distress. ?HEENT: PERRLA, TMs pearly gray, turbinates minimally edematous without discharge, post-pharynx non erythematous. ?Neck: Supple without lymphadenopathy. ?Lungs: Clear to auscultation without wheezing, rhonchi or rales. {no increased work of breathing. ?CV: Normal S1, S2 without murmurs. ?Abdomen: Nondistended, nontender. ?Skin: Warm and dry, without lesions or rashes. ?Extremities:  No clubbing, cyanosis or edema. ?Neuro:   Grossly intact. ? ?Diagnositics/Labs: ?Spirometry: FEV1:  2.03 L 91%, FVC: 2.33 L 86%, ratio consistent with nonobstructive pattern ? ?Assessment and plan: ?Allergic rhinitis with conjunctivitis ? - continue avoidance measures for trees, weeds, grasses, molds, dust mites, cockroach ? - hold Astelin and Flonase for now while using Atrovent  ? - for better sinus drainage control (likely causing continued nighttime cough) will  change to nasal Atrovent 0.06% 2 sprays each nostril in evening/bedtime and can use up to 3-4 times a day if needed.  Atrovent works best for drainage control but can have some congestion control as well ? - continue your antihistamine supplement as needed.  If becomes less effective can take Xyzal (long-acting antihistamine) ? - for itchy/watery/red eyes use Pazeo or Pataday 1 drop each eye daily as needed ? - continue allergen immunotherapy per schedule.  At maintenance dosing every 3 weeks! ? ?Reactive airway ?- continue AirDuo 1 puff twice a day ?- have access to albuterol inhaler 2 puffs every 4-6 hours as needed for cough/wheeze/shortness of breath/chest tightness.  May use 15-20 minutes prior to activity.   Monitor frequency of use.   ? ?Control goals:  ?Full participation in all desired activities (may need albuterol before activity) ?Albuterol use two time or less a week on average (not counting use with activity) ?Cough interfering with sleep two time or less a month ?Oral steroids no more than once a year ?No hospitalizations ? ?Pollen food allergy syndrome ? - tolerating carrots, apples and peaches!! This means that the allergy shots have helped to decrease your sensitivity to pollens.   ?  ? ?Follow-up 6 months or sooner if needed ? ? ? ?I appreciate the opportunity to take part in Rebecca Simon's care. Please do not hesitate to contact me with questions. ? ?Sincerely, ? ? ?Prudy Feeler, MD ?Allergy/Immunology ?[facility] ? ? ?

## 2021-08-23 NOTE — Patient Instructions (Addendum)
Allergies ? - continue avoidance measures for trees, weeds, grasses, molds, dust mites, cockroach ? - hold Astelin and Flonase for now while using Atrovent  ? - for better sinus drainage control (likely causing continued nighttime cough) will change to nasal Atrovent 0.06% 2 sprays each nostril in evening/bedtime and can use up to 3-4 times a day if needed.  Atrovent works best for drainage control but can have some congestion control as well ? - continue your antihistamine supplement as needed.  If becomes less effective can take Xyzal (long-acting antihistamine) ? - for itchy/watery/red eyes use Pazeo or Pataday 1 drop each eye daily as needed ? - continue allergen immunotherapy per schedule.  At maintenance dosing every 3 weeks! ? ?Reactive airway ?- continue AirDuo 1 puff twice a day ?- have access to albuterol inhaler 2 puffs every 4-6 hours as needed for cough/wheeze/shortness of breath/chest tightness.  May use 15-20 minutes prior to activity.   Monitor frequency of use.   ? ?Control goals:  ?Full participation in all desired activities (may need albuterol before activity) ?Albuterol use two time or less a week on average (not counting use with activity) ?Cough interfering with sleep two time or less a month ?Oral steroids no more than once a year ?No hospitalizations ? ?Pollen food allergy syndrome ? - tolerating carrots, apples and peaches!! This means that the allergy shots have helped to decrease your sensitivity to pollens.   ?  ? ?Follow-up 6 months or sooner if needed ? ?

## 2021-08-27 NOTE — Progress Notes (Signed)
? ?  ?  ?[facility] ?  ?HOME SLEEP TEST REPORT ( by Watch PAT)   ?STUDY DATA:  08-27-2021 ?  ?ORDERING CLINICIAN: Larey Seat, MD  ?REFERRING CLINICIAN: Dr Ethlyn Gallery ?  ?CLINICAL INFORMATION/HISTORY: Rebecca Simon is a 46 y.o. African American female patient and seen upon referral on 07/17/2021 from Dr. Tana Felts for a sleep apnea evaluation.  ?symptomatic snoring in the setting of class 2 obesity. " I had nerve pain and neck surgery and I can't sleep as well on the side or stomach"  Restricted sleep time- getting about 5 hours of sleep. Waking up many times and staying awake in bed. She is often notched by her family to turn over- snoring is loudest when on her back. ?The patient  has a past medical history of Allergy and Asthma, Blood transfusion without reported diagnosis, COVID-19 (12/30/2020), GERD (gastroesophageal reflux disease), Morbid Obesity, Cervical Disc replacement with anterior access, Sinusitis, Pneumonia 2019 Recurrent upper respiratory infection (URI). ? ?  ?Epworth sleepiness score: 5/24. ?  ?BMI: 37.5kg/m? ?  ?Neck Circumference: 16 ?  ?FINDINGS: ?  ?Sleep Summary: ?  ?Total Recording Time (hours, min): Total recording time amounted to 7 hours and 24 minutes of which 6 hours and 36 minutes with a total recorded sleep time with a proportion of 12.6% REM sleep.     ?                              ?  ?Respiratory Indices: ?  ?Calculated pAHI (per hour): The apnea hypopnea index overall was 33.3/h and was most severe during REM sleep.                          ?  ?REM pAHI: 64.4/h                                             ?  ?NREM pAHI:   29.9/h                         ?  ?Positional AHI: There was no major dependence on sleep position noted in supine sleep the patient spent 252 minutes with an AHI of 35.6, she did not sleep prone, on the right side the patient slept 97.5 minutes with an AHI of 33/h, ?on the left side she only spent 25.5 minutes with an AHI of 19/h.  ? ?The mean  volume of snoring was extremely loud at 54 dB.  Snoring was present for 95% of the total recorded sleep time which is also very severe.                                              ?  ?Oxygen Saturation Statistics: ?O2 Saturation Range (%): This varied between a nadir of 76% and a maximum of 98% with a mean saturation of 93% oxygen.                                     ?  ?O2 Saturation (minutes) <89%: 6 minutes       ?  ?  Pulse Rate Statistics: ?  ?Pulse Mean (bpm):    91 bpm           ?  ?Pulse Range: Between 50 to 130 bpm, the lowest heart rate was recorded during REM sleep.             ?  ?IMPRESSION:  This HST confirms the presence of severe sleep apnea which is not dependent on the patient's sleep position but clearly REM sleep exacerbated.  Given also a low nadir of oxygen saturation this patient has no alternatives to the therapy by positive airway pressure. ?  ?RECOMMENDATION:An autotitration CPAP device will be ordered with a setting between 7 and 20 cmH2O pressure, 3 cm EPR, heated humidification at auto setting, and a mask of patient's choice and comfort.  Due to the loud snoring a full facemask will likely be needed. ?Please inform the patient that if she has trouble initiating sleep and her CPAP I will provide temporarily a sleep aid for her. ?A revisit will be scheduled for timeframe of 30 to 90 days after initiation of therapy. ?Please explained to the patient that compliance means 4 hours or more of nightly use and that she can exchange the mask for free within 30 days of starting therapy. ? ?  ?INTERPRETING PHYSICIAN: ? ? Larey Seat, MD  ? ?Medical Director of [facility] at [employer].  ? ? ? ? ? ? ? ? ? ? ? ? ? ? ? ? ? ? ? ? ? ?

## 2021-08-29 NOTE — Procedures (Signed)
?[facility] ?  ?HOME SLEEP TEST REPORT ( by Watch PAT)   ?STUDY DATA:  08-27-2021 ?  ?ORDERING CLINICIAN: Melvyn Novas, MD  ?REFERRING CLINICIAN: Dr Hassan Rowan ?  ?CLINICAL INFORMATION/HISTORY: Rebecca Simon is a 46 y.o. African American female patient and seen upon referral on 07/17/2021 from Dr. Miguel Rota for a sleep apnea evaluation.  ?symptomatic snoring in the setting of class 2 obesity. " I had nerve pain and neck surgery and I can't sleep as well on the side or stomach"  Restricted sleep time- getting about 5 hours of sleep. Waking up many times and staying awake in bed. She is often notched by her family to turn over- snoring is loudest when on her back. She wakes up with morning headaches and frequently sinus headaches.  ?The patient has a past medical history of Allergy and Asthma, Blood transfusion without reported diagnosis, COVID-19 (12/30/2020), GERD (gastroesophageal reflux disease), Morbid Obesity, Cervical Disc replacement with anterior access, Sinusitis, Pneumonia 2019 Recurrent upper respiratory infection (URI). ? ?  ?Epworth sleepiness score: 5/24. ?  ?BMI: 37.5kg/m? ?  ?Neck Circumference: 16 ?  ?FINDINGS: ?  ?Sleep Summary: ?  ?Total Recording Time (hours, min): Total recording time amounted to 7 hours and 24 minutes of which 6 hours and 36 minutes with a total recorded sleep time with a proportion of 12.6% REM sleep.     ?                              ?  ?Respiratory Indices: ?  ?Calculated pAHI (per hour): The apnea hypopnea index overall was 33.3/h and was most severe during REM sleep.                          ?  ?REM pAHI: 64.4/h                                             ?  ?NREM pAHI:   29.9/h                         ?  ?Positional AHI: There was no major dependence on sleep position noted in supine sleep the patient spent 252 minutes with an AHI of 35.6, she did not sleep prone, on the right side the patient slept 97.5 minutes with an AHI of 33/h, ?on the left side she  only spent 25.5 minutes with an AHI of 19/h.  ? ?The mean volume of snoring was extremely loud at 54 dB.  Snoring was present for 95% of the total recorded sleep time which is also very severe.                                              ?  ?Oxygen Saturation Statistics: ?O2 Saturation Range (%): This varied between a nadir of 76% and a maximum of 98% with a mean saturation of 93% oxygen.                                     ?  ?O2  Saturation (minutes) <89%: 6 minutes       ?  ?Pulse Rate Statistics: ?  ?Pulse Mean (bpm):    91 bpm           ?  ?Pulse Range: Between 50 to 130 bpm, the lowest heart rate was recorded during REM sleep.             ?  ?IMPRESSION:  This HST confirms the presence of severe sleep apnea which is not dependent on the patient's sleep position but clearly REM sleep exacerbated.  Given also a low nadir of oxygen saturation this patient has no alternatives to the therapy by positive airway pressure. ? ?For the long term, weight loss will be the best treatment of apnea.  ?  ?RECOMMENDATION:An autotitration CPAP device will be ordered with a setting between 7 and 20 cmH2O pressure, 3 cm EPR, heated humidification at auto setting, and a mask of patient's choice and comfort.  Due to the loud snoring a full facemask will likely be needed. ?Please inform the patient that if she has trouble initiating sleep and her CPAP I will provide temporarily a sleep aid for her. ?A revisit will be scheduled for timeframe of 30 to 90 days after initiation of therapy. ?Please explained to the patient that compliance means 4 hours or more of nightly use and that she can exchange the mask for free within 30 days of starting therapy. ? ?  ?INTERPRETING PHYSICIAN: ? ? Melvyn Novas, MD  ? ?Medical Director of [facility] at [employer].  ? ? ? ? ? ? ? ? ? ? ? ? ? ? ? ? ? ? ? ? ? ?

## 2021-08-29 NOTE — Addendum Note (Signed)
Addended by: Larey Seat on: 08/29/2021 05:47 PM ? ? Modules accepted: Orders ? ?

## 2021-08-30 ENCOUNTER — Telehealth: Payer: Self-pay | Admitting: *Deleted

## 2021-08-30 NOTE — Telephone Encounter (Signed)
I called pt. I advised pt that Dr. Vickey Huger reviewed their sleep study results and found that pt has severe sleep apnea. Dr. Vickey Huger recommends that pt starts auto CPAP. I reviewed PAP compliance expectations with the pt. Pt is agreeable to starting a CPAP. I advised pt that an order will be sent to a DME, [facility], and [facility] will call the pt within about one week after they file with the pt's insurance. [facility] will show the pt how to use the machine, fit for masks, and troubleshoot the CPAP if needed. A follow up appt was made for insurance purposes with Dr. Vickey Huger on 11/05/2021 at 9:30 am. Pt verbalized understanding to arrive 15 minutes early and bring their CPAP. A letter with all of this information in it will be mailed to the pt as a reminder. I verified with the pt that the address we have on file is correct. Pt verbalized understanding of results. Pt had no questions at this time but was encouraged to call back if questions arise. I have sent the order to [facility] and have received confirmation that they have received the order.

## 2021-08-30 NOTE — Telephone Encounter (Signed)
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  Given also a low nadir of oxygen saturation this patient has no alternatives to the therapy by positive airway pressure.  For the long term, weight loss will be the best treatment of apnea.   RECOMMENDATION:An autotitration CPAP device will be ordered with a setting between 7 and 20 cmH2O pressure, 3 cm EPR, heated humidification at auto setting, and a mask of patient's choice and comfort.  Due to the loud snoring a full facemask will likely be needed. Please inform the patient that if she has trouble initiating sleep and her CPAP I will provide temporarily a sleep aid for her. A revisit will be scheduled for timeframe of 30 to 90 days after initiation of therapy. Please explained to the patient that compliance means 4 hours or more of nightly use and that she can exchange the mask for free within 30 days of starting therapy.   INTERPRETING PHYSICIAN:

## 2021-08-30 NOTE — Telephone Encounter (Signed)
LVM for pt to call about results. °

## 2021-09-20 ENCOUNTER — Ambulatory Visit (INDEPENDENT_AMBULATORY_CARE_PROVIDER_SITE_OTHER): Payer: 59

## 2021-09-20 DIAGNOSIS — J309 Allergic rhinitis, unspecified: Secondary | ICD-10-CM

## 2021-09-28 ENCOUNTER — Ambulatory Visit (INDEPENDENT_AMBULATORY_CARE_PROVIDER_SITE_OTHER): Payer: 59 | Admitting: *Deleted

## 2021-09-28 DIAGNOSIS — J309 Allergic rhinitis, unspecified: Secondary | ICD-10-CM | POA: Diagnosis not present

## 2021-10-04 ENCOUNTER — Ambulatory Visit (INDEPENDENT_AMBULATORY_CARE_PROVIDER_SITE_OTHER): Payer: 59

## 2021-10-04 DIAGNOSIS — J309 Allergic rhinitis, unspecified: Secondary | ICD-10-CM | POA: Diagnosis not present

## 2021-10-11 ENCOUNTER — Ambulatory Visit (INDEPENDENT_AMBULATORY_CARE_PROVIDER_SITE_OTHER): Payer: 59

## 2021-10-11 DIAGNOSIS — J309 Allergic rhinitis, unspecified: Secondary | ICD-10-CM | POA: Diagnosis not present

## 2021-10-17 ENCOUNTER — Other Ambulatory Visit: Payer: Self-pay | Admitting: Allergy

## 2021-10-18 ENCOUNTER — Ambulatory Visit (INDEPENDENT_AMBULATORY_CARE_PROVIDER_SITE_OTHER): Payer: 59

## 2021-10-18 DIAGNOSIS — J309 Allergic rhinitis, unspecified: Secondary | ICD-10-CM | POA: Diagnosis not present

## 2021-10-26 ENCOUNTER — Ambulatory Visit (INDEPENDENT_AMBULATORY_CARE_PROVIDER_SITE_OTHER): Payer: 59 | Admitting: *Deleted

## 2021-10-26 DIAGNOSIS — J309 Allergic rhinitis, unspecified: Secondary | ICD-10-CM

## 2021-11-02 ENCOUNTER — Ambulatory Visit (INDEPENDENT_AMBULATORY_CARE_PROVIDER_SITE_OTHER): Payer: 59

## 2021-11-02 DIAGNOSIS — J309 Allergic rhinitis, unspecified: Secondary | ICD-10-CM

## 2021-11-05 ENCOUNTER — Ambulatory Visit: Payer: 59 | Admitting: Neurology

## 2021-11-12 ENCOUNTER — Ambulatory Visit (INDEPENDENT_AMBULATORY_CARE_PROVIDER_SITE_OTHER): Payer: 59

## 2021-11-12 DIAGNOSIS — J309 Allergic rhinitis, unspecified: Secondary | ICD-10-CM | POA: Diagnosis not present

## 2021-11-22 ENCOUNTER — Telehealth: Payer: Self-pay

## 2021-11-22 NOTE — Telephone Encounter (Signed)
Fluticasone - Salmeterol, sensor (AirDuo Digihaler) 232-14 MCG/ACT AEPB PA....  KEY: [id number] - PA Case ID: [id number] created: 11/22/21 Sent to [id number] if not resulted in 24 hrs.

## 2021-11-26 NOTE — Telephone Encounter (Signed)
PA was denied for Airduo stating that the patient must try and fail Cluticasone Furoate, [place], [place], [place]. Patient has only tried and failed Symbicort. Please advise change in inhaler on behalf of Dr. Delorse Lek, thank you.

## 2021-11-27 MED ORDER — FLUTICASONE FUROATE-VILANTEROL 100-25 MCG/ACT IN AEPB
1.0000 | INHALATION_SPRAY | Freq: Every day | RESPIRATORY_TRACT | 5 refills | Status: DC
Start: 2021-11-27 — End: 2022-03-01

## 2021-11-27 NOTE — Telephone Encounter (Signed)
Let's do Breo one puff once daily.  Malachi Bonds, MD [facility]

## 2021-11-27 NOTE — Addendum Note (Signed)
Addended by: Rolland Bimler D on: 11/27/2021 12:19 PM   Modules accepted: Orders

## 2021-11-27 NOTE — Telephone Encounter (Addendum)
Per Dr. Dellis Anes. Breo 100 mcg one puff once a day has been sent to pharmacy on file. Called patient and informed her of the change from Airduo to [facility] and that it was sent into the pharmacy on file.

## 2021-12-04 ENCOUNTER — Ambulatory Visit (INDEPENDENT_AMBULATORY_CARE_PROVIDER_SITE_OTHER): Payer: 59 | Admitting: *Deleted

## 2021-12-04 DIAGNOSIS — J309 Allergic rhinitis, unspecified: Secondary | ICD-10-CM | POA: Diagnosis not present

## 2021-12-18 ENCOUNTER — Other Ambulatory Visit: Payer: Self-pay | Admitting: Allergy

## 2021-12-31 ENCOUNTER — Ambulatory Visit (INDEPENDENT_AMBULATORY_CARE_PROVIDER_SITE_OTHER): Payer: 59 | Admitting: *Deleted

## 2021-12-31 DIAGNOSIS — J309 Allergic rhinitis, unspecified: Secondary | ICD-10-CM | POA: Diagnosis not present

## 2022-01-01 ENCOUNTER — Ambulatory Visit (INDEPENDENT_AMBULATORY_CARE_PROVIDER_SITE_OTHER): Payer: 59 | Admitting: Family Medicine

## 2022-01-01 ENCOUNTER — Encounter: Payer: Self-pay | Admitting: Family Medicine

## 2022-01-01 VITALS — BP 110/68 | HR 72 | Temp 98.4°F | Ht 61.0 in | Wt 195.3 lb

## 2022-01-01 DIAGNOSIS — E669 Obesity, unspecified: Secondary | ICD-10-CM

## 2022-01-01 DIAGNOSIS — Z3041 Encounter for surveillance of contraceptive pills: Secondary | ICD-10-CM

## 2022-01-01 DIAGNOSIS — J3089 Other allergic rhinitis: Secondary | ICD-10-CM | POA: Diagnosis not present

## 2022-01-01 LAB — LIPID PANEL
Cholesterol: 182 mg/dL (ref 0–200)
HDL: 49.2 mg/dL (ref >39.00)
LDL Cholesterol: 118 mg/dL — ABNORMAL HIGH (ref 0–99)
NonHDL: 132.62
Total CHOL/HDL Ratio: 4
Triglycerides: 75 mg/dL (ref 0.0–149.0)
VLDL: 15 mg/dL (ref 0.0–40.0)

## 2022-01-01 LAB — TSH: TSH: 0.69 u[IU]/mL (ref 0.35–5.50)

## 2022-01-01 LAB — COMPREHENSIVE METABOLIC PANEL
ALT: 23 U/L (ref 0–35)
AST: 19 U/L (ref 0–37)
Albumin: 3.8 g/dL (ref 3.5–5.2)
Alkaline Phosphatase: 94 U/L (ref 39–117)
BUN: 11 mg/dL (ref 6–23)
CO2: 25 mEq/L (ref 19–32)
Calcium: 9 mg/dL (ref 8.4–10.5)
Chloride: 106 mEq/L (ref 96–112)
Creatinine, Ser: 1.01 mg/dL (ref 0.40–1.20)
GFR: 66.7 mL/min (ref >60.00)
Glucose, Bld: 95 mg/dL (ref 70–99)
Potassium: 4.1 mEq/L (ref 3.5–5.1)
Sodium: 138 mEq/L (ref 135–145)
Total Bilirubin: 0.4 mg/dL (ref 0.2–1.2)
Total Protein: 7.8 g/dL (ref 6.0–8.3)

## 2022-01-01 MED ORDER — NORETHIN-ETH ESTRAD-FE BIPHAS 1 MG-10 MCG / 10 MCG PO TABS: 1.0000 | ORAL_TABLET | Freq: Every evening | ORAL | 11 refills | Status: DC

## 2022-01-01 MED ORDER — PHENTERMINE HCL 15 MG PO CAPS: 15.0000 mg | ORAL_CAPSULE | ORAL | 2 refills | Status: DC

## 2022-01-01 NOTE — Progress Notes (Signed)
VIALS EXP 01-02-23 

## 2022-01-01 NOTE — Progress Notes (Signed)
Established Patient Office Visit  Subjective   Patient ID: Rebecca Simon, female    DOB: [birth year]  Age: 46 y.o. MRN: [id number]  Chief Complaint  Patient presents with   Establish Care    Patient is here for transition of care visit.   Obesity-- she reports she is having trouble losing weight. States that she was prescribed phentermine in the past and it was working well for her initially. She would like to continue the medications. We discussed dietary control and increasing exercise to help with weight loss.   Pt reports she needs refills on her oral contraceptive today. She denies any side effects to the medications. She is UTD on her pap smear and colonoscopy.    Current Outpatient Medications  Medication Instructions   albuterol (VENTOLIN HFA) 108 (90 Base) MCG/ACT inhaler INHALE 2 PUFFS BY MOUTH EVERY 4 HOURS AS NEEDED FOR WHEEZE OR FOR SHORTNESS OF BREATH   azelastine (ASTELIN) 0.1 % nasal spray 2 sprays, Each Nare, 2 times daily, Use in each nostril as directed   fluticasone (FLONASE) 50 MCG/ACT nasal spray 2 sprays, Each Nare, Daily   fluticasone furoate-vilanterol (BREO ELLIPTA) 100-25 MCG/ACT AEPB 1 puff, Inhalation, Daily   ipratropium (ATROVENT) 0.06 % nasal spray Place 2 sprays each nostril in evening/bedtime and can use up to 3-4 times a day if needed   Norethindrone-Ethinyl Estradiol-Fe Biphas (LO LOESTRIN FE) 1 MG-10 MCG / 10 MCG tablet 1 tablet, Oral, Every evening    Patient Active Problem List   Diagnosis Date Noted   Mild intermittent asthma without complication 28/31/5176   Class 2 severe obesity due to excess calories with serious comorbidity and body mass index (BMI) of 37.0 to 37.9 in adult ([place]) 07/17/2021   Acute recurrent pansinusitis 07/17/2021   Sleep related headaches 07/17/2021   Non-restorative sleep 07/17/2021   Excessive daytime sleepiness 07/17/2021   Loud snoring 07/17/2021   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     Objective:     BP 110/68 (BP Location: Left Arm, Patient Position: Sitting, Cuff Size: Large)   Pulse 72   Temp 98.4 F (36.9 C) (Oral)   Ht 5\' 1"  (1.549 m)   Wt 195 lb 4.8 oz (88.6 kg)   SpO2 98%   BMI 36.90 kg/m  BP Readings from Last 3 Encounters:  01/01/22 110/68  08/23/21 122/80  08/06/21 98/60   Wt Readings from Last 3 Encounters:  01/01/22 195 lb 4.8 oz (88.6 kg)  08/23/21 197 lb (89.4 kg)  08/06/21 197 lb 12.8 oz (89.7 kg)      Physical Exam Vitals reviewed.  Constitutional:      Appearance: Normal appearance. She is well-groomed. She is obese.  Eyes:     Conjunctiva/sclera: Conjunctivae normal.  Neck:     Thyroid: No thyromegaly.  Cardiovascular:     Rate and Rhythm: Normal rate and regular rhythm.     Pulses: Normal pulses.     Heart sounds: S1 normal and S2 normal.  Pulmonary:     Effort: Pulmonary effort is normal.     Breath sounds: Normal breath sounds and air entry.  Abdominal:     General:  Abdomen is flat. Bowel sounds are normal.     Palpations: Abdomen is soft.  Musculoskeletal:        General: Normal range of motion.     Cervical back: Normal range of motion and neck supple.     Right lower leg: No edema.     Left lower leg: No edema.  Skin:    General: Skin is warm and dry.  Neurological:     Mental Status: She is alert and oriented to person, place, and time. Mental status is at baseline.     Gait: Gait is intact.  Psychiatric:        Mood and Affect: Mood and affect normal.        Speech: Speech normal.        Behavior: Behavior normal.        Judgment: Judgment normal.    Last hemoglobin A1c Lab Results  Component Value Date    HGBA1C 5.5 05/29/2020      The 10-year ASCVD risk score (Arnett DK, et al., 2019) is: 0.7%    Assessment & Plan:   Problem List Items Addressed This Visit       Other   Obesity (BMI 35.0-39.9 without comorbidity) - Primary    I have had an extensive conversation today with the patient about healthy eating habits, exercise, calorie and carb goals for sustainable and successful weight loss. I gave the patient caloric and protein daily intake values as well as described the importance of increasing fiber and water intake. She will need all of her annual labs today as well. I prescribed 15 mg phentermine capsules daily and I will see her back in the office every 3 months for weight checks and follow up.       Relevant Medications   phentermine 15 MG capsule   Other Relevant Orders   Lipid Panel (Completed)   CMP (Completed)   TSH (Completed)   Other Visit Diagnoses     Encounter for surveillance of contraceptive pills       Relevant Medications   Norethindrone-Ethinyl Estradiol-Fe Biphas (LO LOESTRIN FE) 1 MG-10 MCG / 10 MCG tablet       Return in about 3 months (around 04/02/2022) for med refills and weight check.    Karie Georges, MD

## 2022-01-04 NOTE — Assessment & Plan Note (Addendum)
I have had an extensive conversation today with the patient about healthy eating habits, exercise, calorie and carb goals for sustainable and successful weight loss. I gave the patient caloric and protein daily intake values as well as described the importance of increasing fiber and water intake. She will need all of her annual labs today as well. I prescribed 15 mg phentermine capsules daily and I will see her back in the office every 3 months for weight checks and follow up.

## 2022-01-05 ENCOUNTER — Other Ambulatory Visit: Payer: Self-pay | Admitting: Allergy

## 2022-01-31 ENCOUNTER — Ambulatory Visit (INDEPENDENT_AMBULATORY_CARE_PROVIDER_SITE_OTHER): Payer: 59 | Admitting: *Deleted

## 2022-01-31 DIAGNOSIS — J309 Allergic rhinitis, unspecified: Secondary | ICD-10-CM | POA: Diagnosis not present

## 2022-02-03 ENCOUNTER — Other Ambulatory Visit: Payer: Self-pay | Admitting: Allergy

## 2022-02-27 ENCOUNTER — Ambulatory Visit: Payer: 59 | Admitting: Allergy

## 2022-03-01 ENCOUNTER — Encounter: Payer: Self-pay | Admitting: Allergy

## 2022-03-01 ENCOUNTER — Ambulatory Visit: Payer: 59 | Admitting: Allergy

## 2022-03-01 ENCOUNTER — Ambulatory Visit (INDEPENDENT_AMBULATORY_CARE_PROVIDER_SITE_OTHER): Payer: 59

## 2022-03-01 ENCOUNTER — Other Ambulatory Visit: Payer: Self-pay | Admitting: Allergy

## 2022-03-01 ENCOUNTER — Other Ambulatory Visit: Payer: Self-pay

## 2022-03-01 VITALS — BP 132/76 | HR 81 | Temp 98.7°F | Resp 20 | Ht 61.0 in | Wt 205.3 lb

## 2022-03-01 DIAGNOSIS — H1013 Acute atopic conjunctivitis, bilateral: Secondary | ICD-10-CM | POA: Diagnosis not present

## 2022-03-01 DIAGNOSIS — T781XXD Other adverse food reactions, not elsewhere classified, subsequent encounter: Secondary | ICD-10-CM

## 2022-03-01 DIAGNOSIS — J309 Allergic rhinitis, unspecified: Secondary | ICD-10-CM | POA: Diagnosis not present

## 2022-03-01 DIAGNOSIS — J3089 Other allergic rhinitis: Secondary | ICD-10-CM | POA: Diagnosis not present

## 2022-03-01 DIAGNOSIS — J4540 Moderate persistent asthma, uncomplicated: Secondary | ICD-10-CM | POA: Diagnosis not present

## 2022-03-01 MED ORDER — LEVOCETIRIZINE DIHYDROCHLORIDE 5 MG PO TABS: 5.0000 mg | ORAL_TABLET | Freq: Every evening | ORAL | 5 refills | Status: DC

## 2022-03-01 MED ORDER — IPRATROPIUM BROMIDE 0.06 % NA SOLN: 2.0000 | Freq: Three times a day (TID) | NASAL | 5 refills | Status: DC

## 2022-03-01 MED ORDER — FLUTICASONE FUROATE-VILANTEROL 100-25 MCG/ACT IN AEPB: 1.0000 | INHALATION_SPRAY | Freq: Every day | RESPIRATORY_TRACT | 5 refills | Status: DC

## 2022-03-01 MED ORDER — AIRSUPRA 90-80 MCG/ACT IN AERO: 2.0000 | INHALATION_SPRAY | Freq: Four times a day (QID) | RESPIRATORY_TRACT | 1 refills | Status: DC | PRN

## 2022-03-01 NOTE — Patient Instructions (Addendum)
Allergies  - continue avoidance measures for trees, weeds, grasses, molds, dust mites, cockroach  - continue nasal Atrovent 0.06% 2 sprays each nostril in evening/bedtime and can use up to 3-4 times a day if needed.    - continue your antihistamine supplement as needed.  If becomes less effective can take Xyzal (long-acting antihistamine)  - for itchy/watery/red eyes use Pazeo or Pataday 1 drop each eye daily as needed  - continue allergen immunotherapy per schedule.  At maintenance dosing every 3 weeks!  Will discuss how/when may be able to stop immunotherapy once you reach monthly dosing  Reactive airway - continue Breo 1 puff once a day - have access to albuterol inhaler OR Airsupra 2 puffs every 4-6 hours as needed for cough/wheeze/shortness of breath/chest tightness.  May use 15-20 minutes prior to activity.   Monitor frequency of use.   [name] has albuterol and budesonide (steroid) in it and can provide longer lasting relief of symptoms with as needed use.  Control goals:  Full participation in all desired activities (may need albuterol before activity) Albuterol use two time or less a week on average (not counting use with activity) Cough interfering with sleep two time or less a month Oral steroids no more than once a year No hospitalizations  Pollen food allergy syndrome  - tolerating carrots, apples and peaches!! This means that the allergy shots have helped to decrease your sensitivity to pollens.      Follow-up 6 months or sooner if needed

## 2022-03-01 NOTE — Progress Notes (Signed)
Follow-up Note  RE: Rebecca Simon. Otterson MRN: [id number] DOB: 09/09/1975 Date of Office Visit: 03/01/2022   History of present illness: Rebecca Simon is a 46 y.o. female presenting today for follow-up of allergic rhinitis with conjunctivitis, oral allergy syndrome and reactive airway.  She was last seen in the office on 08/23/21 by myself.  She has not had any major health changes, surgeries or hospitalizations since last visit.    She thinks with the weather changes she has been having issues with her breathing with coughing, wheezing and shortness of breath.  It is very intermittent and dependent on the day.  She will need to use albuterol for these symptoms with relief bu reports is not needing to use more than 1-2 times a week on average.   She states if in a old building she can have more symptoms or if around animals that shed more hair she can develop more symptoms with her allergies like nasal drainage/congestion.  She continues to take her antihistamine supplement.  She will use atrovent as needed and uses prior to having to do a lot of talking and this can lead to tickle in the throat and PND.  She denies having any itchy/watery eyes to use eye drop for.  She is on allergen immunotherapy at maintenance dosing every 3 weeks.  She is tolerating well without large local or systemic reactions.  She continues to be about to eat carrots, apples, peaches fresh/raw without issue.   Review of systems: Review of Systems  Constitutional: Negative.   HENT:         See HPI  Eyes: Negative.   Respiratory:  Positive for cough, shortness of breath and wheezing.   Cardiovascular: Negative.   Gastrointestinal: Negative.   Musculoskeletal: Negative.   Skin: Negative.   Allergic/Immunologic: Negative.   Neurological: Negative.      All other systems negative unless noted above in HPI  Past medical/social/surgical/family history have been reviewed and are unchanged unless specifically indicated  below.  No changes  Medication List: Current Outpatient Medications  Medication Sig Dispense Refill   albuterol (VENTOLIN HFA) 108 (90 Base) MCG/ACT inhaler INHALE 2 PUFFS BY MOUTH EVERY 4 HOURS AS NEEDED FOR WHEEZE OR FOR SHORTNESS OF BREATH 8.5 each 1   Azelastine HCl 137 MCG/SPRAY SOLN USE 2 SPRAYS IN EACH NOSTRIL TWICE DAILY AS DIRECTED 30 mL 0   fluticasone furoate-vilanterol (BREO ELLIPTA) 100-25 MCG/ACT AEPB Inhale 1 puff into the lungs daily. 60 each 5   ipratropium (ATROVENT) 0.06 % nasal spray Place 2 sprays each nostril in evening/bedtime and can use up to 3-4 times a day if needed 15 mL 12   Norethindrone-Ethinyl Estradiol-Fe Biphas (LO LOESTRIN FE) 1 MG-10 MCG / 10 MCG tablet Take 1 tablet by mouth every evening. 28 tablet 11   phentermine 15 MG capsule Take 1 capsule (15 mg total) by mouth every morning. 30 capsule 2   fluticasone (FLONASE) 50 MCG/ACT nasal spray Place 2 sprays into both nostrils daily. (Patient not taking: Reported on 03/01/2022) 16 g 5   No current facility-administered medications for this visit.     Known medication allergies: Allergies  Allergen Reactions   Sulfa Antibiotics Anaphylaxis and Swelling   Vicodin [Hydrocodone-Acetaminophen] Other (See Comments)    Nightmares, visual disturbances    Physical examination: Blood pressure 132/76, pulse 81, temperature 98.7 F (37.1 C), temperature source Temporal, resp. rate 20, height 5\' 1"  (1.549 m), weight 205 lb 4.8 oz (93.1 kg), SpO2  99 %.  General: Alert, interactive, in no acute distress. HEENT: PERRLA, TMs pearly gray, turbinates minimally edematous without discharge, post-pharynx non erythematous. Neck: Supple without lymphadenopathy. Lungs: Clear to auscultation without wheezing, rhonchi or rales. {no increased work of breathing. CV: Normal S1, S2 without murmurs. Abdomen: Nondistended, nontender. Skin: Warm and dry, without lesions or rashes. Extremities:  No clubbing, cyanosis or  edema. Neuro:   Grossly intact.  Diagnositics/Labs: Received immunotherapy injections today  Spirometry: FEV1: 2.2L 102%, FVC: 2.49L 94%, ratio consistent with nonobstructive pattern  Assessment and plan:   Allergic rhinitis with conjunctivitis  - continue avoidance measures for trees, weeds, grasses, molds, dust mites, cockroach  - continue nasal Atrovent 0.06% 2 sprays each nostril in evening/bedtime and can use up to 3-4 times a day if needed.    - continue your antihistamine supplement as needed.  If becomes less effective can take Xyzal (long-acting antihistamine)  - for itchy/watery/red eyes use Pazeo or Pataday 1 drop each eye daily as needed  - continue allergen immunotherapy per schedule.  At maintenance dosing every 3 weeks!  Will discuss how/when may be able to stop immunotherapy once you reach monthly dosing  Reactive airway - continue Breo 1 puff once a day - have access to albuterol inhaler OR Airsupra 2 puffs every 4-6 hours as needed for cough/wheeze/shortness of breath/chest tightness.  May use 15-20 minutes prior to activity.   Monitor frequency of use.   [name] has albuterol and budesonide (steroid) in it and can provide longer lasting relief of symptoms with as needed use.  Control goals:  Full participation in all desired activities (may need albuterol before activity) Albuterol use two time or less a week on average (not counting use with activity) Cough interfering with sleep two time or less a month Oral steroids no more than once a year No hospitalizations  Pollen food allergy syndrome  - tolerating carrots, apples and peaches!! This means that the allergy shots have helped to decrease your sensitivity to pollens.     Follow-up 6 months or sooner if needed   I appreciate the opportunity to take part in Rebecca Simon's care. Please do not hesitate to contact me with questions.  Sincerely,   Margo Aye, MD Allergy/Immunology [facility]

## 2022-03-05 ENCOUNTER — Ambulatory Visit (INDEPENDENT_AMBULATORY_CARE_PROVIDER_SITE_OTHER): Payer: 59 | Admitting: Student

## 2022-03-05 DIAGNOSIS — J309 Allergic rhinitis, unspecified: Secondary | ICD-10-CM | POA: Diagnosis not present

## 2022-03-10 ENCOUNTER — Other Ambulatory Visit: Payer: Self-pay | Admitting: Allergy

## 2022-03-15 ENCOUNTER — Ambulatory Visit (INDEPENDENT_AMBULATORY_CARE_PROVIDER_SITE_OTHER): Payer: 59

## 2022-03-15 DIAGNOSIS — J309 Allergic rhinitis, unspecified: Secondary | ICD-10-CM

## 2022-04-01 ENCOUNTER — Ambulatory Visit (INDEPENDENT_AMBULATORY_CARE_PROVIDER_SITE_OTHER): Payer: 59

## 2022-04-01 DIAGNOSIS — J309 Allergic rhinitis, unspecified: Secondary | ICD-10-CM | POA: Diagnosis not present

## 2022-04-02 ENCOUNTER — Encounter: Payer: Self-pay | Admitting: Family Medicine

## 2022-04-02 ENCOUNTER — Ambulatory Visit (INDEPENDENT_AMBULATORY_CARE_PROVIDER_SITE_OTHER): Payer: 59 | Admitting: Family Medicine

## 2022-04-02 VITALS — BP 112/80 | HR 80 | Temp 98.1°F | Ht 61.0 in | Wt 201.8 lb

## 2022-04-02 DIAGNOSIS — M545 Low back pain, unspecified: Secondary | ICD-10-CM | POA: Diagnosis not present

## 2022-04-02 DIAGNOSIS — E669 Obesity, unspecified: Secondary | ICD-10-CM | POA: Diagnosis not present

## 2022-04-02 DIAGNOSIS — G8929 Other chronic pain: Secondary | ICD-10-CM | POA: Diagnosis not present

## 2022-04-02 MED ORDER — PHENTERMINE HCL 15 MG PO CAPS: 15.0000 mg | ORAL_CAPSULE | ORAL | 0 refills | Status: DC

## 2022-04-02 MED ORDER — CYCLOBENZAPRINE HCL 10 MG PO TABS: 5.0000 mg | ORAL_TABLET | Freq: Every day | ORAL | 2 refills | Status: DC

## 2022-04-02 MED ORDER — MELOXICAM 15 MG PO TABS: 15.0000 mg | ORAL_TABLET | Freq: Every day | ORAL | 1 refills | Status: DC

## 2022-04-02 NOTE — Assessment & Plan Note (Signed)
We discussed this problem, I recommended using once daily meloxicam 15 mg and she may use PRN cyclobenzaprine 5-10 mg at bedtime for muscle stiffness. I encouraged her to discuss this problem with her surgeon Dr. Shon Baton if the pain continues or is not responsive to the NSAIDS and muscle relaxers.

## 2022-04-02 NOTE — Assessment & Plan Note (Signed)
4 pound weight loss since the last recorded weight on 03/01/22. Will refill another 3 months of phentermine and see her back in 3 months for weight check.

## 2022-04-02 NOTE — Progress Notes (Signed)
Established Patient Office Visit  Subjective   Patient ID: Rebecca Simon, female    DOB: 17-Jun-1975  Age: 46 y.o. MRN: [id number]  Chief Complaint  Patient presents with   Follow-up    Patient is here for follow up for weight loss. States that the phentermine is working well, states that she has lost some weight. She has been reducing carbs in her diet and reducing the amount she is eating. Not really able to exercise due to chronic low back and shoulder pain. She denies any side effects to the medication, no palpitations, anxiety or constipation.   States that her chronic back pain has been acting up recently. States that she had the cervical surgery about 1 year ago, state that she did have improvement with the surgery, however it's her lower back that is bothering her more, states that she is not taking anything at home for the pain.    Current Outpatient Medications  Medication Instructions   albuterol (VENTOLIN HFA) 108 (90 Base) MCG/ACT inhaler INHALE 2 PUFFS BY MOUTH EVERY 4 HOURS AS NEEDED FOR WHEEZE OR FOR SHORTNESS OF BREATH   Albuterol-Budesonide (AIRSUPRA) 90-80 MCG/ACT AERO 2 puffs, Inhalation, 4 times daily PRN   Azelastine HCl 137 MCG/SPRAY SOLN USE 2 SPRAYS IN EACH NOSTRIL TWICE DAILY AS DIRECTED   cyclobenzaprine (FLEXERIL) 5-10 mg, Oral, Daily at bedtime   fluticasone (FLONASE) 50 MCG/ACT nasal spray 2 sprays, Each Nare, Daily   fluticasone furoate-vilanterol (BREO ELLIPTA) 100-25 MCG/ACT AEPB 1 puff, Inhalation, Daily   ipratropium (ATROVENT) 0.06 % nasal spray Place 2 sprays each nostril in evening/bedtime and can use up to 3-4 times a day if needed   ipratropium (ATROVENT) 0.06 % nasal spray 2 sprays, Each Nare, 3 times daily   levocetirizine (XYZAL) 5 mg, Oral, Every evening   meloxicam (MOBIC) 15 mg, Oral, Daily   Norethindrone-Ethinyl Estradiol-Fe Biphas (LO LOESTRIN FE) 1 MG-10 MCG / 10 MCG tablet 1 tablet, Oral, Every evening   [START ON 06/01/2022]  phentermine 15 mg, Oral, BH-each morning   [START ON 05/02/2022] phentermine 15 mg, Oral, BH-each morning   phentermine 15 mg, Oral, BH-each morning    Patient Active Problem List   Diagnosis Date Noted   Chronic low back pain without sciatica 04/02/2022   Mild intermittent asthma without complication 07/17/2021   Class 2 severe obesity due to excess calories with serious comorbidity and body mass index (BMI) of 37.0 to 37.9 in adult (HCC) 07/17/2021   Acute recurrent pansinusitis 07/17/2021   Sleep related headaches 07/17/2021   Non-restorative sleep 07/17/2021   Excessive daytime sleepiness 07/17/2021   Loud snoring 07/17/2021   Anti-TPO antibodies present 02/26/2021   Obesity (BMI 35.0-39.9 without comorbidity) 02/26/2021   DOE (dyspnea on exertion) 01/20/2018   Cough variant asthma  vs UACS  01/20/2018   Seasonal allergies 08/03/2014   Obesity 08/03/2014   GERD (gastroesophageal reflux disease) 08/03/2014      Review of Systems  All other systems reviewed and are negative.     Objective:     BP 112/80 (BP Location: Left Arm, Patient Position: Sitting, Cuff Size: Large)   Pulse 80   Temp 98.1 F (36.7 C) (Oral)   Ht 5\' 1"  (1.549 m)   Wt 201 lb 12.8 oz (91.5 kg)   SpO2 98%   BMI 38.13 kg/m    Physical Exam Vitals reviewed.  Constitutional:      Appearance: Normal appearance. She is well-groomed. She is obese.  Eyes:  Conjunctiva/sclera: Conjunctivae normal.  Neck:     Thyroid: No thyromegaly.  Cardiovascular:     Rate and Rhythm: Normal rate and regular rhythm.     Pulses: Normal pulses.     Heart sounds: S1 normal and S2 normal.  Pulmonary:     Effort: Pulmonary effort is normal.     Breath sounds: Normal breath sounds and air entry.  Neurological:     Mental Status: She is alert and oriented to person, place, and time. Mental status is at baseline.     Gait: Gait is intact.  Psychiatric:        Mood and Affect: Mood and affect normal.         Speech: Speech normal.        Behavior: Behavior normal.        Judgment: Judgment normal.    No results found for any visits on 04/02/22.    The 10-year ASCVD risk score (Arnett DK, et al., 2019) is: 0.7%    Assessment & Plan:   Problem List Items Addressed This Visit       Unprioritized   Obesity (BMI 35.0-39.9 without comorbidity) - Primary    4 pound weight loss since the last recorded weight on 03/01/22. Will refill another 3 months of phentermine and see her back in 3 months for weight check.       Relevant Medications   phentermine 15 MG capsule (Start on 06/01/2022)   phentermine 15 MG capsule (Start on 05/02/2022)   phentermine 15 MG capsule   Chronic low back pain without sciatica (Chronic)    We discussed this problem, I recommended using once daily meloxicam 15 mg and she may use PRN cyclobenzaprine 5-10 mg at bedtime for muscle stiffness. I encouraged her to discuss this problem with her surgeon Dr. Shon Baton if the pain continues or is not responsive to the NSAIDS and muscle relaxers.       Relevant Medications   meloxicam (MOBIC) 15 MG tablet   cyclobenzaprine (FLEXERIL) 10 MG tablet    Return in about 3 months (around 07/02/2022) for weight check.    Karie Georges, MD

## 2022-04-16 ENCOUNTER — Ambulatory Visit (INDEPENDENT_AMBULATORY_CARE_PROVIDER_SITE_OTHER): Payer: 59

## 2022-04-16 DIAGNOSIS — J309 Allergic rhinitis, unspecified: Secondary | ICD-10-CM | POA: Diagnosis not present

## 2022-04-26 ENCOUNTER — Ambulatory Visit (INDEPENDENT_AMBULATORY_CARE_PROVIDER_SITE_OTHER): Payer: 59

## 2022-04-26 DIAGNOSIS — J309 Allergic rhinitis, unspecified: Secondary | ICD-10-CM

## 2022-05-03 ENCOUNTER — Ambulatory Visit (INDEPENDENT_AMBULATORY_CARE_PROVIDER_SITE_OTHER): Payer: 59

## 2022-05-03 DIAGNOSIS — J309 Allergic rhinitis, unspecified: Secondary | ICD-10-CM

## 2022-05-10 ENCOUNTER — Ambulatory Visit (INDEPENDENT_AMBULATORY_CARE_PROVIDER_SITE_OTHER): Payer: 59

## 2022-05-10 DIAGNOSIS — J309 Allergic rhinitis, unspecified: Secondary | ICD-10-CM | POA: Diagnosis not present

## 2022-05-30 ENCOUNTER — Ambulatory Visit (INDEPENDENT_AMBULATORY_CARE_PROVIDER_SITE_OTHER): Payer: 59 | Admitting: *Deleted

## 2022-05-30 DIAGNOSIS — J309 Allergic rhinitis, unspecified: Secondary | ICD-10-CM | POA: Diagnosis not present

## 2022-06-25 ENCOUNTER — Ambulatory Visit (INDEPENDENT_AMBULATORY_CARE_PROVIDER_SITE_OTHER): Payer: 59

## 2022-06-25 DIAGNOSIS — J309 Allergic rhinitis, unspecified: Secondary | ICD-10-CM

## 2022-07-02 ENCOUNTER — Ambulatory Visit: Payer: 59 | Admitting: Family Medicine

## 2022-07-03 ENCOUNTER — Encounter: Payer: Self-pay | Admitting: Family Medicine

## 2022-07-03 ENCOUNTER — Ambulatory Visit (INDEPENDENT_AMBULATORY_CARE_PROVIDER_SITE_OTHER): Payer: 59 | Admitting: Family Medicine

## 2022-07-03 DIAGNOSIS — Z6838 Body mass index (BMI) 38.0-38.9, adult: Secondary | ICD-10-CM

## 2022-07-03 LAB — POCT GLYCOSYLATED HEMOGLOBIN (HGB A1C): Hemoglobin A1C: 5.3 % (ref 4.0–5.6)

## 2022-07-03 MED ORDER — PHENTERMINE HCL 30 MG PO CAPS: 30.0000 mg | ORAL_CAPSULE | ORAL | 0 refills | Status: DC

## 2022-07-03 NOTE — Assessment & Plan Note (Signed)
Still having difficulty despite treatment with phentermine. I will increase the dose to 30 mg capsules daily. I gave her more specific goals to adhere to and recommended app tracking of her foods and goals. A1C is negative for diabetes. I will see her back in 3 months for follow up.

## 2022-07-03 NOTE — Progress Notes (Signed)
Established Patient Office Visit  Subjective   Patient ID: Rebecca Simon, female    DOB: June 23, 1975  Age: 47 y.o. MRN: [id number]  Chief Complaint  Patient presents with   Medical Management of Chronic Issues    Pt is here for follow up for weight loss. She is currently on 15 mg phentermine capsules daily. She reports that her weight is "holding steady" but she hasn't been able to lose a significant amount with the current diet plan and exercise plan. I did check her A1C today which was normal. We did talk again about changing the diet and eating plan and she is agreeable to this. The new calorie and carb goals I gave her are listed below. I also advised that she use an app on her phone to track her goals more closely.   Total calories: 1800 calories per day  Total carbs: around 90 grams per day  Total protein: at least 100 grams per day   Current Outpatient Medications  Medication Instructions   albuterol (VENTOLIN HFA) 108 (90 Base) MCG/ACT inhaler INHALE 2 PUFFS BY MOUTH EVERY 4 HOURS AS NEEDED FOR WHEEZE OR FOR SHORTNESS OF BREATH   Albuterol-Budesonide (AIRSUPRA) 90-80 MCG/ACT AERO 2 puffs, Inhalation, 4 times daily PRN   Azelastine HCl 137 MCG/SPRAY SOLN USE 2 SPRAYS IN EACH NOSTRIL TWICE DAILY AS DIRECTED   cyclobenzaprine (FLEXERIL) 5-10 mg, Oral, Daily at bedtime   fluticasone (FLONASE) 50 MCG/ACT nasal spray 2 sprays, Each Nare, Daily   fluticasone furoate-vilanterol (BREO ELLIPTA) 100-25 MCG/ACT AEPB 1 puff, Inhalation, Daily   ipratropium (ATROVENT) 0.06 % nasal spray Place 2 sprays each nostril in evening/bedtime and can use up to 3-4 times a day if needed   ipratropium (ATROVENT) 0.06 % nasal spray 2 sprays, Each Nare, 3 times daily   levocetirizine (XYZAL) 5 mg, Oral, Every evening   meloxicam (MOBIC) 15 mg, Oral, Daily   Norethindrone-Ethinyl Estradiol-Fe Biphas (LO LOESTRIN FE) 1 MG-10 MCG / 10 MCG tablet 1 tablet, Oral, Every evening   [START ON 08/29/2022]  phentermine 30 mg, Oral, BH-each morning   [START ON 08/01/2022] phentermine 30 mg, Oral, BH-each morning   phentermine 30 mg, Oral, BH-each morning    Patient Active Problem List   Diagnosis Date Noted   Chronic low back pain without sciatica 04/02/2022   Mild intermittent asthma without complication 99991111   Class 2 severe obesity due to excess calories with serious comorbidity and body mass index (BMI) of 37.0 to 37.9 in adult ([facility]) 07/17/2021   Acute recurrent pansinusitis 07/17/2021   Sleep related headaches 07/17/2021   Non-restorative sleep 07/17/2021   Excessive daytime sleepiness 07/17/2021   Loud snoring 07/17/2021   Anti-TPO antibodies present 02/26/2021   Obesity (BMI 35.0-39.9 without comorbidity) 02/26/2021   DOE (dyspnea on exertion) 01/20/2018   Cough variant asthma  vs UACS  01/20/2018   Seasonal allergies 08/03/2014   Obesity 08/03/2014   GERD (gastroesophageal reflux disease) 08/03/2014      Review of Systems  All other systems reviewed and are negative.     Objective:     BP 136/84 (BP Location: Left Arm, Patient Position: Sitting, Cuff Size: Large)   Pulse 82   Temp 98.3 F (36.8 C) (Oral)   Ht 5\' 1"  (1.549 m)   Wt 205 lb 3.2 oz (93.1 kg)   SpO2 98%   BMI 38.77 kg/m    Physical Exam Vitals reviewed.  Constitutional:      Appearance: Normal appearance. She  is well-groomed and normal weight.  Cardiovascular:     Rate and Rhythm: Normal rate and regular rhythm.     Pulses: Normal pulses.     Heart sounds: S1 normal and S2 normal.  Pulmonary:     Effort: Pulmonary effort is normal.     Breath sounds: Normal breath sounds and air entry.  Neurological:     Mental Status: She is alert and oriented to person, place, and time. Mental status is at baseline.     Gait: Gait is intact.  Psychiatric:        Mood and Affect: Mood and affect normal.        Speech: Speech normal.        Behavior: Behavior normal.        Judgment: Judgment normal.       Results for orders placed or performed in visit on 07/03/22  POC HgB A1c  Result Value Ref Range   Hemoglobin A1C 5.3 4.0 - 5.6 %   HbA1c POC (<> result, manual entry)     HbA1c, POC (prediabetic range)     HbA1c, POC (controlled diabetic range)        The 10-year ASCVD risk score (Arnett DK, et al., 2019) is: 2%    Assessment & Plan:   Problem List Items Addressed This Visit       Unprioritized   Obesity - Primary    Still having difficulty despite treatment with phentermine. I will increase the dose to 30 mg capsules daily. I gave her more specific goals to adhere to and recommended app tracking of her foods and goals. A1C is negative for diabetes. I will see her back in 3 months for follow up.      Relevant Medications   phentermine 30 MG capsule (Start on 08/29/2022)   phentermine 30 MG capsule (Start on 08/01/2022)   phentermine 30 MG capsule   Other Relevant Orders   POC HgB A1c (Completed)    Return in about 3 months (around 10/03/2022) for weight loss.    Farrel Conners, MD

## 2022-07-03 NOTE — Patient Instructions (Addendum)
App for calorie and carb tracking: MyFitnesspal, Loseit!, Carb manager  Total calories: 1800 calories per day  Total carbs: around 90 grams per day  Total protein: at least 100 grams per day

## 2022-07-06 ENCOUNTER — Other Ambulatory Visit: Payer: Self-pay | Admitting: Family Medicine

## 2022-07-06 DIAGNOSIS — G8929 Other chronic pain: Secondary | ICD-10-CM

## 2022-07-23 ENCOUNTER — Ambulatory Visit (INDEPENDENT_AMBULATORY_CARE_PROVIDER_SITE_OTHER): Payer: 59

## 2022-07-23 DIAGNOSIS — J309 Allergic rhinitis, unspecified: Secondary | ICD-10-CM | POA: Diagnosis not present

## 2022-07-30 DIAGNOSIS — J3089 Other allergic rhinitis: Secondary | ICD-10-CM | POA: Diagnosis not present

## 2022-07-30 NOTE — Progress Notes (Signed)
VIALS EXP 07-30-23 

## 2022-08-03 ENCOUNTER — Other Ambulatory Visit: Payer: Self-pay | Admitting: Family Medicine

## 2022-08-03 DIAGNOSIS — G8929 Other chronic pain: Secondary | ICD-10-CM

## 2022-08-11 ENCOUNTER — Other Ambulatory Visit: Payer: Self-pay | Admitting: Allergy

## 2022-08-15 ENCOUNTER — Ambulatory Visit (INDEPENDENT_AMBULATORY_CARE_PROVIDER_SITE_OTHER): Payer: 59

## 2022-08-15 DIAGNOSIS — J309 Allergic rhinitis, unspecified: Secondary | ICD-10-CM

## 2022-09-01 ENCOUNTER — Other Ambulatory Visit: Payer: Self-pay | Admitting: Allergy

## 2022-09-01 ENCOUNTER — Other Ambulatory Visit: Payer: Self-pay | Admitting: Family Medicine

## 2022-09-01 DIAGNOSIS — G8929 Other chronic pain: Secondary | ICD-10-CM

## 2022-09-05 ENCOUNTER — Ambulatory Visit: Payer: 59 | Admitting: Allergy

## 2022-09-05 ENCOUNTER — Other Ambulatory Visit: Payer: Self-pay

## 2022-09-05 ENCOUNTER — Ambulatory Visit: Payer: Self-pay

## 2022-09-05 ENCOUNTER — Encounter: Payer: Self-pay | Admitting: Allergy

## 2022-09-05 VITALS — BP 116/82 | HR 93 | Temp 98.4°F | Resp 18 | Ht 61.5 in | Wt 206.1 lb

## 2022-09-05 DIAGNOSIS — J309 Allergic rhinitis, unspecified: Secondary | ICD-10-CM

## 2022-09-05 DIAGNOSIS — J4540 Moderate persistent asthma, uncomplicated: Secondary | ICD-10-CM

## 2022-09-05 DIAGNOSIS — H1013 Acute atopic conjunctivitis, bilateral: Secondary | ICD-10-CM | POA: Diagnosis not present

## 2022-09-05 DIAGNOSIS — T781XXD Other adverse food reactions, not elsewhere classified, subsequent encounter: Secondary | ICD-10-CM | POA: Diagnosis not present

## 2022-09-05 DIAGNOSIS — J3089 Other allergic rhinitis: Secondary | ICD-10-CM

## 2022-09-05 MED ORDER — FLUTICASONE FUROATE-VILANTEROL 100-25 MCG/ACT IN AEPB: 1.0000 | INHALATION_SPRAY | Freq: Every day | RESPIRATORY_TRACT | 5 refills | Status: DC

## 2022-09-05 MED ORDER — IPRATROPIUM BROMIDE 0.06 % NA SOLN: 2.0000 | Freq: Three times a day (TID) | NASAL | 5 refills | Status: AC | PRN

## 2022-09-05 MED ORDER — AIRSUPRA 90-80 MCG/ACT IN AERO: 2.0000 | INHALATION_SPRAY | Freq: Four times a day (QID) | RESPIRATORY_TRACT | 1 refills | Status: DC | PRN

## 2022-09-05 MED ORDER — LEVOCETIRIZINE DIHYDROCHLORIDE 5 MG PO TABS: 5.0000 mg | ORAL_TABLET | Freq: Every day | ORAL | 5 refills | Status: DC | PRN

## 2022-09-05 NOTE — Progress Notes (Signed)
Follow-up Note  RE: Rebecca Simon MRN: [id number] DOB: [birth year] Date of Office Visit: 09/05/2022   History of present illness: Rebecca Simon is a 47 y.o. female presenting today for follow-up of allergic rhinitis with conjunctivitis, pollen food allergy syndrome, reactive airway.  She was last seen in the office on 03/01/22 by myself.    She states the spring up until about a week ago she was doing well.  Within the past week or so she has noticed more cough coming back with some drainage and wheezing.  More symptomatic at night.  She has not needed to take any antihistamines in a while as her symptoms have been going great.  She is using atrovent sprays about 3 times a day in the morning, midday and in the evening.  With the cough and wheeze she has not tries use of albuterol/airsupra.  She states she has used [place] for relief of symptoms in the colder months and did find it to help and last longer than regular albuterol.  She continues on breo 1 inhalation daily.  She has not required any ED or urgent care visits or systemic steroid needs from a respiratory standpoint.  Review of systems: Review of Systems  Constitutional: Negative.   HENT: Negative.    Eyes: Negative.   Respiratory:  Positive for cough and wheezing.   Cardiovascular: Negative.   Gastrointestinal: Negative.   Musculoskeletal: Negative.   Skin: Negative.   Allergic/Immunologic: Negative.   Neurological: Negative.      All other systems negative unless noted above in HPI  Past medical/social/surgical/family history have been reviewed and are unchanged unless specifically indicated below.  No changes  Medication List: Current Outpatient Medications  Medication Sig Dispense Refill   albuterol (VENTOLIN HFA) 108 (90 Base) MCG/ACT inhaler INHALE 2 PUFFS BY MOUTH EVERY 4 HOURS AS NEEDED FOR WHEEZE OR FOR SHORTNESS OF BREATH 8.5 each 1   Albuterol-Budesonide (AIRSUPRA) 90-80 MCG/ACT AERO Inhale 2 puffs into  the lungs 4 (four) times daily as needed. 10.7 g 1   Azelastine HCl 137 MCG/SPRAY SOLN USE 2 SPRAYS IN EACH NOSTRIL TWICE DAILY AS DIRECTED 30 mL 1   cyclobenzaprine (FLEXERIL) 10 MG tablet TAKE 1/2 TO 1 TABLET (5-10 MG TOTAL) BY MOUTH EVERY DAY AT BEDTIME 30 tablet 0   fluticasone furoate-vilanterol (BREO ELLIPTA) 100-25 MCG/ACT AEPB Inhale 1 puff into the lungs daily. 60 each 5   ipratropium (ATROVENT) 0.06 % nasal spray Place 2 sprays each nostril in evening/bedtime and can use up to 3-4 times a day if needed 15 mL 12   ipratropium (ATROVENT) 0.06 % nasal spray PLACE 2 SPRAYS INTO BOTH NOSTRILS 3 (THREE) TIMES DAILY 15 mL 0   levocetirizine (XYZAL) 5 MG tablet TAKE 1 TABLET BY MOUTH EVERY DAY IN THE EVENING 30 tablet 0   meloxicam (MOBIC) 15 MG tablet Take 1 tablet (15 mg total) by mouth daily. 90 tablet 1   Norethindrone-Ethinyl Estradiol-Fe Biphas (LO LOESTRIN FE) 1 MG-10 MCG / 10 MCG tablet Take 1 tablet by mouth every evening. 28 tablet 11   phentermine 30 MG capsule Take 1 capsule (30 mg total) by mouth every morning. 30 capsule 0   phentermine 30 MG capsule Take 1 capsule (30 mg total) by mouth every morning. 30 capsule 0   phentermine 30 MG capsule Take 1 capsule (30 mg total) by mouth every morning. 30 capsule 0   fluticasone (FLONASE) 50 MCG/ACT nasal spray Place 2 sprays into both nostrils  daily. (Patient not taking: Reported on 09/05/2022) 16 g 5   No current facility-administered medications for this visit.     Known medication allergies: Allergies  Allergen Reactions   Sulfa Antibiotics Anaphylaxis and Swelling   Vicodin [Hydrocodone-Acetaminophen] Other (See Comments)    Nightmares, visual disturbances     Physical examination: Blood pressure 116/82, pulse 93, temperature 98.4 F (36.9 C), resp. rate 18, height 5' 1.5" (1.562 m), weight 206 lb 1 oz (93.5 kg), SpO2 97 %.  General: Alert, interactive, in no acute distress. HEENT: PERRLA, TMs pearly gray, turbinates  minimally edematous without discharge, post-pharynx non erythematous. Neck: Supple without lymphadenopathy. Lungs: Clear to auscultation without wheezing, rhonchi or rales. {no increased work of breathing. CV: Normal S1, S2 without murmurs. Abdomen: Nondistended, nontender. Skin: Warm and dry, without lesions or rashes. Extremities:  No clubbing, cyanosis or edema. Neuro:   Grossly intact.  Diagnositics/Labs:  Spirometry: FEV1: 2.46L 111%, FVC: 2.74L 101%, ratio consistent with nonobstructive pattern  Assessment and plan: Allergic rhinitis with conjunctivitis  - improved on immunotherapy  - continue avoidance measures for trees, weeds, grasses, molds, dust mites, cockroach  - continue nasal Atrovent 0.06% 2 sprays each nostril in evening/bedtime and can use up to 3-4 times a day if needed.    - continue your antihistamine supplement as needed.  If rescue inhaler as below does not relieve cough/wheeze symptoms then use your antihistamine.    - for itchy/watery/red eyes use Pazeo or Pataday 1 drop each eye daily as needed  - continue allergen immunotherapy per schedule.  At maintenance dosing every 3 weeks!  You will reach monthly injections this summer and will stay there for 6-12 months with plan to stop at that time.    Reactive airway - Lung function testing looks great today! - continue Breo 1 puff once a day - have access to albuterol inhaler OR Airsupra 2 puffs every 4-6 hours as needed for cough/wheeze/shortness of breath/chest tightness.  May use 15-20 minutes prior to activity.   Monitor frequency of use.   [name] has albuterol and budesonide (steroid) in it and can provide longer lasting relief of symptoms with as needed use.  Control goals:  Full participation in all desired activities (may need albuterol before activity) Albuterol use two time or less a week on average (not counting use with activity) Cough interfering with sleep two time or less a month Oral steroids no  more than once a year No hospitalizations  Pollen food allergy syndrome  - tolerating carrots, apples and peaches. This means that the allergy shots have helped to decrease your sensitivity to pollens.      Follow-up 6 months or sooner if needed  I appreciate the opportunity to take part in Rebecca Simon's care. Please do not hesitate to contact me with questions.  Sincerely,   Margo Aye, MD Allergy/Immunology [facility]

## 2022-09-05 NOTE — Patient Instructions (Addendum)
Allergies  - continue avoidance measures for trees, weeds, grasses, molds, dust mites, cockroach  - continue nasal Atrovent 0.06% 2 sprays each nostril in evening/bedtime and can use up to 3-4 times a day if needed.    - continue your antihistamine supplement as needed.  If rescue inhaler as below does not relieve cough/wheeze symptoms then use your antihistamine.    - for itchy/watery/red eyes use Pazeo or Pataday 1 drop each eye daily as needed  - continue allergen immunotherapy per schedule.  At maintenance dosing every 3 weeks!  You will reach monthly injections this summer and will stay there for 6-12 months with plan to stop at that time.    Reactive airway - Lung function testing looks great today! - continue Breo 1 puff once a day - have access to albuterol inhaler OR Airsupra 2 puffs every 4-6 hours as needed for cough/wheeze/shortness of breath/chest tightness.  May use 15-20 minutes prior to activity.   Monitor frequency of use.   [name] has albuterol and budesonide (steroid) in it and can provide longer lasting relief of symptoms with as needed use.  Control goals:  Full participation in all desired activities (may need albuterol before activity) Albuterol use two time or less a week on average (not counting use with activity) Cough interfering with sleep two time or less a month Oral steroids no more than once a year No hospitalizations  Pollen food allergy syndrome  - tolerating carrots, apples and peaches.  She is this means that the allergy shots have helped to decrease your sensitivity to pollens.      Follow-up 6 months or sooner if needed

## 2022-09-12 ENCOUNTER — Ambulatory Visit (INDEPENDENT_AMBULATORY_CARE_PROVIDER_SITE_OTHER): Payer: 59

## 2022-09-12 DIAGNOSIS — J309 Allergic rhinitis, unspecified: Secondary | ICD-10-CM | POA: Diagnosis not present

## 2022-09-19 ENCOUNTER — Ambulatory Visit (INDEPENDENT_AMBULATORY_CARE_PROVIDER_SITE_OTHER): Payer: 59

## 2022-09-19 DIAGNOSIS — J309 Allergic rhinitis, unspecified: Secondary | ICD-10-CM | POA: Diagnosis not present

## 2022-09-28 ENCOUNTER — Other Ambulatory Visit: Payer: Self-pay | Admitting: Allergy

## 2022-09-30 ENCOUNTER — Ambulatory Visit (INDEPENDENT_AMBULATORY_CARE_PROVIDER_SITE_OTHER): Payer: 59 | Admitting: *Deleted

## 2022-09-30 DIAGNOSIS — J309 Allergic rhinitis, unspecified: Secondary | ICD-10-CM | POA: Diagnosis not present

## 2022-10-02 ENCOUNTER — Other Ambulatory Visit: Payer: Self-pay | Admitting: Family Medicine

## 2022-10-02 DIAGNOSIS — G8929 Other chronic pain: Secondary | ICD-10-CM

## 2022-10-03 ENCOUNTER — Encounter: Payer: Self-pay | Admitting: Family Medicine

## 2022-10-03 ENCOUNTER — Ambulatory Visit (INDEPENDENT_AMBULATORY_CARE_PROVIDER_SITE_OTHER): Payer: 59 | Admitting: Family Medicine

## 2022-10-03 VITALS — BP 156/90 | HR 78 | Temp 98.5°F | Ht 61.5 in | Wt 205.0 lb

## 2022-10-03 DIAGNOSIS — Z6838 Body mass index (BMI) 38.0-38.9, adult: Secondary | ICD-10-CM | POA: Diagnosis not present

## 2022-10-03 DIAGNOSIS — R030 Elevated blood-pressure reading, without diagnosis of hypertension: Secondary | ICD-10-CM | POA: Diagnosis not present

## 2022-10-03 MED ORDER — PHENTERMINE HCL 30 MG PO CAPS
30.0000 mg | ORAL_CAPSULE | ORAL | 0 refills | Status: DC
Start: 2022-10-03 — End: 2023-01-07

## 2022-10-03 MED ORDER — PHENTERMINE HCL 30 MG PO CAPS
30.0000 mg | ORAL_CAPSULE | ORAL | 0 refills | Status: DC
Start: 2022-10-31 — End: 2023-01-07

## 2022-10-03 MED ORDER — PHENTERMINE HCL 30 MG PO CAPS: 30.0000 mg | ORAL_CAPSULE | ORAL | 0 refills | Status: DC

## 2022-10-03 NOTE — Progress Notes (Signed)
Established Patient Office Visit  Subjective   Patient ID: Rebecca Simon. Guarino, female    DOB: 10-May-1975  Age: 47 y.o. MRN: [id number]  Chief Complaint  Patient presents with   Medical Management of Chronic Issues    Patient is here for follow up today. She reports that she is in a little bit of pain today, states that she was unable to get the phentermine filled at the pharmacy for some reason. She reports that the phentermine is working however she isn't really seeing any results from the medication. States that "I know it is my diet". We discussed using more strict guidelines to help her change her diet. I gave her specific goals and wrote them down for her today.   BP in office performed and remains elevated today. She  is not checking her BP at home. She denies any chest pain, no SOB or headaches. We discussed the risks/benefits of high blood pressure, she states she is in some pain today due to her back. Will recheck at next visit.        Current Outpatient Medications  Medication Instructions   albuterol (VENTOLIN HFA) 108 (90 Base) MCG/ACT inhaler INHALE 2 PUFFS BY MOUTH EVERY 4 HOURS AS NEEDED FOR WHEEZE OR FOR SHORTNESS OF BREATH   Albuterol-Budesonide (AIRSUPRA) 90-80 MCG/ACT AERO 2 puffs, Inhalation, 4 times daily PRN   Azelastine HCl 137 MCG/SPRAY SOLN USE 2 SPRAYS IN EACH NOSTRIL TWICE DAILY AS DIRECTED   cyclobenzaprine (FLEXERIL) 10 MG tablet TAKE 1/2 TO 1 TABLET (5-10 MG TOTAL) BY MOUTH EVERY DAY AT BEDTIME   fluticasone (FLONASE) 50 MCG/ACT nasal spray 2 sprays, Each Nare, Daily   fluticasone furoate-vilanterol (BREO ELLIPTA) 100-25 MCG/ACT AEPB 1 puff, Inhalation, Daily   ipratropium (ATROVENT) 0.06 % nasal spray Place 2 sprays each nostril in evening/bedtime and can use up to 3-4 times a day if needed   ipratropium (ATROVENT) 0.06 % nasal spray 2 sprays, Each Nare, 3 times daily PRN   levocetirizine (XYZAL) 5 mg, Oral, Daily PRN   meloxicam (MOBIC) 15 mg, Oral, Daily    Norethindrone-Ethinyl Estradiol-Fe Biphas (LO LOESTRIN FE) 1 MG-10 MCG / 10 MCG tablet 1 tablet, Oral, Every evening   [START ON 11/29/2022] phentermine 30 mg, Oral, BH-each morning   [START ON 10/31/2022] phentermine 30 mg, Oral, BH-each morning   phentermine 30 mg, Oral, BH-each morning    Patient Active Problem List   Diagnosis Date Noted   Elevated blood pressure reading 10/04/2022   Chronic low back pain without sciatica 04/02/2022   Mild intermittent asthma without complication 07/17/2021   Class 2 severe obesity due to excess calories with serious comorbidity and body mass index (BMI) of 37.0 to 37.9 in adult (HCC) 07/17/2021   Acute recurrent pansinusitis 07/17/2021   Sleep related headaches 07/17/2021   Non-restorative sleep 07/17/2021   Excessive daytime sleepiness 07/17/2021   Loud snoring 07/17/2021   Anti-TPO antibodies present 02/26/2021   Obesity (BMI 35.0-39.9 without comorbidity) 02/26/2021   DOE (dyspnea on exertion) 01/20/2018   Cough variant asthma  vs UACS  01/20/2018   Seasonal allergies 08/03/2014   Obesity 08/03/2014   GERD (gastroesophageal reflux disease) 08/03/2014      Review of Systems  All other systems reviewed and are negative.     Objective:     BP (!) 156/90   Pulse 78   Temp 98.5 F (36.9 C) (Oral)   Ht 5' 1.5" (1.562 m)   Wt 205 lb (93 kg)  SpO2 98%   BMI 38.11 kg/m    Physical Exam Vitals reviewed.  Constitutional:      Appearance: Normal appearance. She is obese.  Eyes:     Conjunctiva/sclera: Conjunctivae normal.  Pulmonary:     Effort: Pulmonary effort is normal.  Musculoskeletal:     Right lower leg: No edema.     Left lower leg: No edema.  Neurological:     Mental Status: She is alert and oriented to person, place, and time. Mental status is at baseline.  Psychiatric:        Mood and Affect: Mood normal.        Behavior: Behavior normal.      No results found for any visits on 10/03/22.    The 10-year  ASCVD risk score (Arnett DK, et al., 2019) is: 3.6%    Assessment & Plan:  Elevated blood pressure reading Assessment & Plan: BP in office is elevated again today. We discussed checking her BP at home and she is to let me know what her readings are at home. If it is elevated at the next visit I will take her off the phentermine and start blood pressure medication.   Class 2 severe obesity due to excess calories with serious comorbidity and body mass index (BMI) of 38.0 to 38.9 in adult [facility]) Assessment & Plan: Patient was not able to get the 30 mg capsules from the pharmacy -- not sure why. I will send in new scripts to the pharmacy and I gave her more specific goals and advised that she get an app on her phone that will help her track her calories and carbs, RTC 3 months for weight check.   Orders: -     Phentermine HCl; Take 1 capsule (30 mg total) by mouth every morning.  Dispense: 30 capsule; Refill: 0 -     Phentermine HCl; Take 1 capsule (30 mg total) by mouth every morning.  Dispense: 30 capsule; Refill: 0 -     Phentermine HCl; Take 1 capsule (30 mg total) by mouth every morning.  Dispense: 30 capsule; Refill: 0   Total calories per day: 1800 per day  Total carbohydrates: less than 70 grams of carbs per day  Total protein: 100 grams of protein per day  Fiber: 35 grams per day  Return in about 3 months (around 01/03/2023) for weight loss-- fasting for blood work.    Karie Georges, MD

## 2022-10-03 NOTE — Patient Instructions (Addendum)
Total calories per day: 1800 per day  Total carbohydrates: less than 70 grams of carbs per day  Total protein: 100 grams of protein per day  Fiber: 35 grams per day

## 2022-10-04 DIAGNOSIS — R030 Elevated blood-pressure reading, without diagnosis of hypertension: Secondary | ICD-10-CM | POA: Insufficient documentation

## 2022-10-04 NOTE — Assessment & Plan Note (Signed)
BP in office is elevated again today. We discussed checking her BP at home and she is to let me know what her readings are at home. If it is elevated at the next visit I will take her off the phentermine and start blood pressure medication.

## 2022-10-04 NOTE — Assessment & Plan Note (Signed)
Patient was not able to get the 30 mg capsules from the pharmacy -- not sure why. I will send in new scripts to the pharmacy and I gave her more specific goals and advised that she get an app on her phone that will help her track her calories and carbs, RTC 3 months for weight check.

## 2022-10-15 ENCOUNTER — Ambulatory Visit (INDEPENDENT_AMBULATORY_CARE_PROVIDER_SITE_OTHER): Payer: 59

## 2022-10-15 DIAGNOSIS — J309 Allergic rhinitis, unspecified: Secondary | ICD-10-CM | POA: Diagnosis not present

## 2022-10-24 ENCOUNTER — Ambulatory Visit (INDEPENDENT_AMBULATORY_CARE_PROVIDER_SITE_OTHER): Payer: 59

## 2022-10-24 DIAGNOSIS — J309 Allergic rhinitis, unspecified: Secondary | ICD-10-CM | POA: Diagnosis not present

## 2022-11-01 ENCOUNTER — Other Ambulatory Visit: Payer: Self-pay | Admitting: Family Medicine

## 2022-11-01 DIAGNOSIS — G8929 Other chronic pain: Secondary | ICD-10-CM

## 2022-11-07 ENCOUNTER — Other Ambulatory Visit: Payer: Self-pay | Admitting: Allergy

## 2022-11-21 ENCOUNTER — Ambulatory Visit (INDEPENDENT_AMBULATORY_CARE_PROVIDER_SITE_OTHER): Payer: 59

## 2022-11-21 DIAGNOSIS — J309 Allergic rhinitis, unspecified: Secondary | ICD-10-CM | POA: Diagnosis not present

## 2022-11-28 ENCOUNTER — Encounter (INDEPENDENT_AMBULATORY_CARE_PROVIDER_SITE_OTHER): Payer: Self-pay

## 2022-12-26 ENCOUNTER — Ambulatory Visit (INDEPENDENT_AMBULATORY_CARE_PROVIDER_SITE_OTHER): Payer: 59

## 2022-12-26 DIAGNOSIS — J309 Allergic rhinitis, unspecified: Secondary | ICD-10-CM | POA: Diagnosis not present

## 2023-01-01 ENCOUNTER — Other Ambulatory Visit: Payer: Self-pay | Admitting: Family Medicine

## 2023-01-01 DIAGNOSIS — Z3041 Encounter for surveillance of contraceptive pills: Secondary | ICD-10-CM

## 2023-01-03 ENCOUNTER — Ambulatory Visit: Payer: 59 | Admitting: Family Medicine

## 2023-01-07 ENCOUNTER — Ambulatory Visit: Payer: 59 | Admitting: Family Medicine

## 2023-01-07 ENCOUNTER — Encounter: Payer: Self-pay | Admitting: Family Medicine

## 2023-01-07 DIAGNOSIS — Z6838 Body mass index (BMI) 38.0-38.9, adult: Secondary | ICD-10-CM | POA: Diagnosis not present

## 2023-01-07 MED ORDER — TOPIRAMATE 50 MG PO TABS: ORAL_TABLET | ORAL | 2 refills | Status: DC

## 2023-01-07 MED ORDER — PHENTERMINE HCL 30 MG PO CAPS
30.0000 mg | ORAL_CAPSULE | ORAL | 0 refills | Status: DC
Start: 2023-02-04 — End: 2023-04-01

## 2023-01-07 MED ORDER — PHENTERMINE HCL 30 MG PO CAPS
30.0000 mg | ORAL_CAPSULE | ORAL | 0 refills | Status: DC
Start: 2023-03-05 — End: 2023-04-01

## 2023-01-07 MED ORDER — PHENTERMINE HCL 30 MG PO CAPS
30.0000 mg | ORAL_CAPSULE | ORAL | 0 refills | Status: DC
Start: 2023-01-07 — End: 2023-04-01

## 2023-01-08 ENCOUNTER — Telehealth: Payer: Self-pay

## 2023-01-08 LAB — COMPREHENSIVE METABOLIC PANEL
ALT: 26 U/L (ref 0–35)
AST: 22 U/L (ref 0–37)
Albumin: 3.9 g/dL (ref 3.5–5.2)
Alkaline Phosphatase: 118 U/L — ABNORMAL HIGH (ref 39–117)
BUN: 13 mg/dL (ref 6–23)
CO2: 26 mEq/L (ref 19–32)
Calcium: 9.3 mg/dL (ref 8.4–10.5)
Chloride: 102 mEq/L (ref 96–112)
Creatinine, Ser: 1.01 mg/dL (ref 0.40–1.20)
GFR: 66.23 mL/min (ref >60.00)
Glucose, Bld: 44 mg/dL — CL (ref 70–99)
Potassium: 4.4 mEq/L (ref 3.5–5.1)
Sodium: 136 mEq/L (ref 135–145)
Total Bilirubin: 0.4 mg/dL (ref 0.2–1.2)
Total Protein: 7.6 g/dL (ref 6.0–8.3)

## 2023-01-08 LAB — TSH: TSH: 1.22 u[IU]/mL (ref 0.35–5.50)

## 2023-01-08 LAB — LIPID PANEL
Cholesterol: 197 mg/dL (ref 0–200)
HDL: 53.7 mg/dL (ref >39.00)
LDL Cholesterol: 121 mg/dL — ABNORMAL HIGH (ref 0–99)
NonHDL: 143.65
Total CHOL/HDL Ratio: 4
Triglycerides: 114 mg/dL (ref 0.0–149.0)
VLDL: 22.8 mg/dL (ref 0.0–40.0)

## 2023-01-08 NOTE — Telephone Encounter (Signed)
Left a message for the patient to return my call.  

## 2023-01-08 NOTE — Telephone Encounter (Signed)
CRITICAL VALUE STICKER  CRITICAL VALUE: Glucose 44  RECEIVER (on-site recipient of call): Maralyn Sago, CMA  DATE & TIME NOTIFIED: 01/08/23 1234PM  MESSENGER (representative from lab): Saa  MD NOTIFIED: Via inbasket [employer] teamcare.  TIME OF NOTIFICATION:1234PM

## 2023-01-08 NOTE — Telephone Encounter (Signed)
Patient called back and was informed of the results.  Patient stated she was fasting, is feeling fine and was advised to call back if needed.

## 2023-01-13 NOTE — Assessment & Plan Note (Signed)
No weight loss since the last visit, will add topiramate at bedtime to help with additional appetite suppression, I will see her back in 3 months for follow up. I reinforced the diet plan with her and encouraged her to continue using an app on her phone to help her track her calories and carbs.

## 2023-01-24 ENCOUNTER — Ambulatory Visit (INDEPENDENT_AMBULATORY_CARE_PROVIDER_SITE_OTHER): Payer: 59 | Admitting: *Deleted

## 2023-01-24 DIAGNOSIS — J309 Allergic rhinitis, unspecified: Secondary | ICD-10-CM | POA: Diagnosis not present

## 2023-01-31 ENCOUNTER — Other Ambulatory Visit: Payer: Self-pay | Admitting: Family Medicine

## 2023-01-31 DIAGNOSIS — G8929 Other chronic pain: Secondary | ICD-10-CM

## 2023-02-18 ENCOUNTER — Telehealth: Payer: Self-pay | Admitting: *Deleted

## 2023-02-18 DIAGNOSIS — G8929 Other chronic pain: Secondary | ICD-10-CM

## 2023-02-18 MED ORDER — CYCLOBENZAPRINE HCL 10 MG PO TABS
ORAL_TABLET | ORAL | 2 refills | Status: DC
Start: 2023-02-18 — End: 2023-05-26

## 2023-02-18 NOTE — Telephone Encounter (Signed)
Rx re-sent due to transmission problems.

## 2023-02-20 ENCOUNTER — Ambulatory Visit (INDEPENDENT_AMBULATORY_CARE_PROVIDER_SITE_OTHER): Payer: 59

## 2023-02-20 DIAGNOSIS — J309 Allergic rhinitis, unspecified: Secondary | ICD-10-CM

## 2023-02-20 MED ORDER — EPINEPHRINE 0.3 MG/0.3ML IJ SOAJ: 0.3000 mg | INTRAMUSCULAR | 1 refills | Status: DC | PRN

## 2023-02-27 ENCOUNTER — Other Ambulatory Visit: Payer: Self-pay | Admitting: Allergy

## 2023-02-27 ENCOUNTER — Other Ambulatory Visit: Payer: Self-pay | Admitting: Family Medicine

## 2023-02-27 DIAGNOSIS — Z3041 Encounter for surveillance of contraceptive pills: Secondary | ICD-10-CM

## 2023-02-28 ENCOUNTER — Other Ambulatory Visit: Payer: Self-pay | Admitting: Allergy

## 2023-02-28 NOTE — Telephone Encounter (Signed)
Have drug rep looking into this for me

## 2023-03-01 ENCOUNTER — Other Ambulatory Visit: Payer: Self-pay | Admitting: Allergy

## 2023-03-09 DIAGNOSIS — Z7951 Long term (current) use of inhaled steroids: Secondary | ICD-10-CM | POA: Diagnosis not present

## 2023-03-09 DIAGNOSIS — J45909 Unspecified asthma, uncomplicated: Secondary | ICD-10-CM | POA: Diagnosis not present

## 2023-03-09 DIAGNOSIS — Z6838 Body mass index (BMI) 38.0-38.9, adult: Secondary | ICD-10-CM | POA: Diagnosis not present

## 2023-03-09 DIAGNOSIS — M792 Neuralgia and neuritis, unspecified: Secondary | ICD-10-CM | POA: Diagnosis not present

## 2023-03-09 DIAGNOSIS — E669 Obesity, unspecified: Secondary | ICD-10-CM | POA: Diagnosis not present

## 2023-03-09 DIAGNOSIS — M545 Low back pain, unspecified: Secondary | ICD-10-CM | POA: Diagnosis not present

## 2023-03-09 DIAGNOSIS — Z791 Long term (current) use of non-steroidal anti-inflammatories (NSAID): Secondary | ICD-10-CM | POA: Diagnosis not present

## 2023-03-09 DIAGNOSIS — I10 Essential (primary) hypertension: Secondary | ICD-10-CM | POA: Diagnosis not present

## 2023-03-12 ENCOUNTER — Ambulatory Visit: Payer: 59 | Admitting: Allergy

## 2023-03-12 DIAGNOSIS — J309 Allergic rhinitis, unspecified: Secondary | ICD-10-CM

## 2023-03-17 DIAGNOSIS — Z1231 Encounter for screening mammogram for malignant neoplasm of breast: Secondary | ICD-10-CM | POA: Diagnosis not present

## 2023-03-20 ENCOUNTER — Ambulatory Visit (INDEPENDENT_AMBULATORY_CARE_PROVIDER_SITE_OTHER): Payer: 59 | Admitting: *Deleted

## 2023-03-20 DIAGNOSIS — J309 Allergic rhinitis, unspecified: Secondary | ICD-10-CM | POA: Diagnosis not present

## 2023-03-20 NOTE — Progress Notes (Signed)
VIALS EXP 05-10-23

## 2023-03-24 DIAGNOSIS — J3089 Other allergic rhinitis: Secondary | ICD-10-CM | POA: Diagnosis not present

## 2023-04-01 ENCOUNTER — Ambulatory Visit: Payer: 59 | Admitting: Family Medicine

## 2023-04-01 ENCOUNTER — Encounter: Payer: Self-pay | Admitting: Family Medicine

## 2023-04-01 DIAGNOSIS — E66812 Obesity, class 2: Secondary | ICD-10-CM | POA: Diagnosis not present

## 2023-04-01 DIAGNOSIS — G8929 Other chronic pain: Secondary | ICD-10-CM | POA: Diagnosis not present

## 2023-04-01 DIAGNOSIS — Z6838 Body mass index (BMI) 38.0-38.9, adult: Secondary | ICD-10-CM

## 2023-04-01 DIAGNOSIS — Z3041 Encounter for surveillance of contraceptive pills: Secondary | ICD-10-CM | POA: Diagnosis not present

## 2023-04-01 DIAGNOSIS — M545 Low back pain, unspecified: Secondary | ICD-10-CM | POA: Diagnosis not present

## 2023-04-01 MED ORDER — PHENTERMINE HCL 30 MG PO CAPS: 30.0000 mg | ORAL_CAPSULE | ORAL | 0 refills | Status: DC

## 2023-04-01 MED ORDER — TOPIRAMATE 50 MG PO TABS: 50.0000 mg | ORAL_TABLET | Freq: Two times a day (BID) | ORAL | 1 refills | Status: DC

## 2023-04-01 MED ORDER — MELOXICAM 15 MG PO TABS: 15.0000 mg | ORAL_TABLET | Freq: Every day | ORAL | 5 refills | Status: DC

## 2023-04-01 MED ORDER — LO LOESTRIN FE 1 MG-10 MCG / 10 MCG PO TABS: 1.0000 | ORAL_TABLET | Freq: Every day | ORAL | 11 refills | Status: DC

## 2023-04-01 NOTE — Progress Notes (Signed)
Established Patient Office Visit  Subjective   Patient ID: Rebecca Simon. Erlich, female    DOB: 1975-06-13  Age: 47 y.o. MRN: [id number]  Chief Complaint  Patient presents with   Medical Management of Chronic Issues    Pt reports she has been taking the topiramate as prescribed, however she was unable to get the phentermine for 2 months, states that every time she checked the pharmacy she was told it was "too soon", states that she asked the pharmacy to reach out to me but they did not. States she was doing well at home, got down to 196 on her home scale.  She denies any side effects to the topiramate at this time. We discussed increasing it to 50 BID and she is agreeable.     Current Outpatient Medications  Medication Instructions   Albuterol-Budesonide (AIRSUPRA) 90-80 MCG/ACT AERO INHALE 2 PUFFS INTO THE LUNGS 4 TIMES DAILY AS NEEDED.   Azelastine HCl 137 MCG/SPRAY SOLN USE 2 SPRAYS IN EACH NOSTRIL TWICE DAILY AS DIRECTED   cyclobenzaprine (FLEXERIL) 10 MG tablet TAKE 1/2 TO 1 TABLET (5-10 MG TOTAL) BY MOUTH EVERY DAY AT BEDTIME   EPINEPHrine (EPIPEN 2-PAK) 0.3 mg, Intramuscular, As needed   fluticasone (FLONASE) 50 MCG/ACT nasal spray 2 sprays, Each Nare, Daily   fluticasone furoate-vilanterol (BREO ELLIPTA) 100-25 MCG/ACT AEPB 1 puff, Inhalation, Daily   ipratropium (ATROVENT) 0.06 % nasal spray Place 2 sprays each nostril in evening/bedtime and can use up to 3-4 times a day if needed   ipratropium (ATROVENT) 0.06 % nasal spray 2 sprays, Each Nare, 3 times daily PRN   levocetirizine (XYZAL) 5 mg, Oral, Daily PRN   meloxicam (MOBIC) 15 mg, Oral, Daily   Norethindrone-Ethinyl Estradiol-Fe Biphas (LO LOESTRIN FE) 1 MG-10 MCG / 10 MCG tablet 1 tablet, Oral, Daily   phentermine 30 mg, Oral, BH-each morning   phentermine 30 mg, Oral, BH-each morning   phentermine 30 mg, Oral, BH-each morning   topiramate (TOPAMAX) 50 mg, Oral, 2 times daily    Patient Active Problem List   Diagnosis  Date Noted   Elevated blood pressure reading 10/04/2022   Chronic low back pain without sciatica 04/02/2022   Mild intermittent asthma without complication 07/17/2021   Class 2 severe obesity due to excess calories with serious comorbidity and body mass index (BMI) of 37.0 to 37.9 in adult (HCC) 07/17/2021   Acute recurrent pansinusitis 07/17/2021   Sleep related headaches 07/17/2021   Non-restorative sleep 07/17/2021   Excessive daytime sleepiness 07/17/2021   Loud snoring 07/17/2021   Anti-TPO antibodies present 02/26/2021   Obesity (BMI 35.0-39.9 without comorbidity) 02/26/2021   DOE (dyspnea on exertion) 01/20/2018   Cough variant asthma  vs UACS  01/20/2018   Seasonal allergies 08/03/2014   Obesity 08/03/2014   GERD (gastroesophageal reflux disease) 08/03/2014      Review of Systems  All other systems reviewed and are negative.     Objective:     BP 112/80   Pulse 75   Temp 98.6 F (37 C) (Oral)   Ht 5' 1.5" (1.562 m)   Wt 207 lb 3.2 oz (94 kg)   SpO2 98%   BMI 38.52 kg/m    Physical Exam Vitals reviewed.  Constitutional:      Appearance: Normal appearance. She is well-groomed. She is obese.  Cardiovascular:     Rate and Rhythm: Normal rate and regular rhythm.     Pulses: Normal pulses.     Heart sounds: S1 normal  and S2 normal.  Pulmonary:     Effort: Pulmonary effort is normal.     Breath sounds: Normal breath sounds and air entry.  Musculoskeletal:     Right lower leg: No edema.     Left lower leg: No edema.  Neurological:     Mental Status: She is alert and oriented to person, place, and time. Mental status is at baseline.     Gait: Gait is intact.  Psychiatric:        Mood and Affect: Mood and affect normal.        Speech: Speech normal.        Behavior: Behavior normal.      No results found for any visits on 04/01/23.    The 10-year ASCVD risk score (Arnett DK, et al., 2019) is: 0.8%    Assessment & Plan:  Class 2 severe obesity due  to excess calories with serious comorbidity and body mass index (BMI) of 38.0 to 38.9 in adult [facility]) Assessment & Plan: Pt was unable to get the phentermine for 2 months, will send in all new scripts and increase the topiramate to 50 mg BID. I will see her back in 3 months for a recheck / annual physical.   Orders: -     Phentermine HCl; Take 1 capsule (30 mg total) by mouth every morning.  Dispense: 30 capsule; Refill: 0 -     Phentermine HCl; Take 1 capsule (30 mg total) by mouth every morning.  Dispense: 30 capsule; Refill: 0 -     Phentermine HCl; Take 1 capsule (30 mg total) by mouth every morning.  Dispense: 30 capsule; Refill: 0 -     Topiramate; Take 1 tablet (50 mg total) by mouth 2 (two) times daily.  Dispense: 180 tablet; Refill: 1  Encounter for surveillance of contraceptive pills -     Lo Loestrin Fe; Take 1 tablet by mouth daily.  Dispense: 28 tablet; Refill: 11  Chronic bilateral low back pain without sciatica -     Meloxicam; Take 1 tablet (15 mg total) by mouth daily.  Dispense: 30 tablet; Refill: 5     Return in about 3 months (around 06/30/2023) for weight loss, annual physical exam.    Karie Georges, MD

## 2023-04-01 NOTE — Assessment & Plan Note (Signed)
Pt was unable to get the phentermine for 2 months, will send in all new scripts and increase the topiramate to 50 mg BID. I will see her back in 3 months for a recheck / annual physical.

## 2023-04-18 ENCOUNTER — Other Ambulatory Visit: Payer: Self-pay | Admitting: Family Medicine

## 2023-04-24 ENCOUNTER — Ambulatory Visit (INDEPENDENT_AMBULATORY_CARE_PROVIDER_SITE_OTHER): Payer: Self-pay

## 2023-04-24 DIAGNOSIS — J309 Allergic rhinitis, unspecified: Secondary | ICD-10-CM

## 2023-04-28 ENCOUNTER — Other Ambulatory Visit: Payer: Self-pay | Admitting: Family Medicine

## 2023-04-28 DIAGNOSIS — Z3041 Encounter for surveillance of contraceptive pills: Secondary | ICD-10-CM

## 2023-04-29 MED ORDER — NORETHIN ACE-ETH ESTRAD-FE 1-20 MG-MCG PO TABS: 1.0000 | ORAL_TABLET | Freq: Every day | ORAL | 11 refills | Status: DC

## 2023-04-29 NOTE — Telephone Encounter (Signed)
 Spoke with Rebecca Simon at [facility] to see what alternatives are available and she stated they do not have any other names, however Jun[employer] is generally covered by most Medicaid insurances as they have [employer] on file for the [employer].  Patient called back and was informed of this.  She stated she does have [employer] but it is not a Medicaid plan and is aware the message will be sent to PCP to send in a different Rx as Lo Loestrin  is not covered.

## 2023-04-29 NOTE — Telephone Encounter (Signed)
 Left a message for the patient to return my call.

## 2023-05-02 ENCOUNTER — Ambulatory Visit (INDEPENDENT_AMBULATORY_CARE_PROVIDER_SITE_OTHER): Payer: Self-pay

## 2023-05-02 DIAGNOSIS — J309 Allergic rhinitis, unspecified: Secondary | ICD-10-CM

## 2023-05-08 ENCOUNTER — Ambulatory Visit (INDEPENDENT_AMBULATORY_CARE_PROVIDER_SITE_OTHER): Payer: No Typology Code available for payment source | Admitting: Allergy

## 2023-05-08 ENCOUNTER — Other Ambulatory Visit: Payer: Self-pay

## 2023-05-08 ENCOUNTER — Encounter: Payer: Self-pay | Admitting: Allergy

## 2023-05-08 ENCOUNTER — Ambulatory Visit: Payer: Self-pay | Admitting: *Deleted

## 2023-05-08 VITALS — BP 118/80 | Temp 97.2°F | Resp 16

## 2023-05-08 DIAGNOSIS — J3089 Other allergic rhinitis: Secondary | ICD-10-CM

## 2023-05-08 DIAGNOSIS — H1013 Acute atopic conjunctivitis, bilateral: Secondary | ICD-10-CM

## 2023-05-08 DIAGNOSIS — T781XXD Other adverse food reactions, not elsewhere classified, subsequent encounter: Secondary | ICD-10-CM

## 2023-05-08 DIAGNOSIS — J309 Allergic rhinitis, unspecified: Secondary | ICD-10-CM

## 2023-05-08 DIAGNOSIS — J4540 Moderate persistent asthma, uncomplicated: Secondary | ICD-10-CM

## 2023-05-08 MED ORDER — EPINEPHRINE 0.3 MG/0.3ML IJ SOAJ: 0.3000 mg | INTRAMUSCULAR | 1 refills | Status: AC | PRN

## 2023-05-08 MED ORDER — IPRATROPIUM BROMIDE 0.06 % NA SOLN: NASAL | 5 refills | Status: DC

## 2023-05-08 MED ORDER — FLUTICASONE FUROATE-VILANTEROL 100-25 MCG/ACT IN AEPB: 1.0000 | INHALATION_SPRAY | Freq: Every day | RESPIRATORY_TRACT | 5 refills | Status: AC

## 2023-05-08 MED ORDER — LEVOCETIRIZINE DIHYDROCHLORIDE 5 MG PO TABS: 5.0000 mg | ORAL_TABLET | Freq: Every day | ORAL | 5 refills | Status: DC | PRN

## 2023-05-08 NOTE — Progress Notes (Signed)
Follow-up Note  RE: Rebecca Simon Simon. Rebecca Simon Simon MRN: [id number] DOB: 07/11/1975 Date of Office Visit: 05/08/2023   History of present illness: Rebecca Simon Simon is a 48 y.o. female presenting today for follow-up of allergic rhinitis with conjunctivitis, reactive airway and pollen food allergy syndrome.  She was last seen in the office on 09/05/2022 by myself. Discussed the use of AI scribe software for clinical note transcription with the patient, who gave verbal consent to proceed.    She reports a significant improvement in her breathing over the past couple of months. She denies any recent respiratory illnesses such as colds, flu, or COVID-19. She has used her rescue inhaler only a few times.  The patient confirms the use of Airsupra, which she reports to be effective and lasting longer than plain albuterol. She also continues to use Breo, a once-daily inhaler, and has noticed significant improvement in her respiratory symptoms.  She has not required any ED or urgent care visits or any systemic steroid needs. She reports no need for eye drops and has been able to tolerate exposure to allergens such as certain foods that would cause oral allergy syndrome symptoms in the past and a new pet dog she recently got the winter. She has been on allergy shots since 2021 and is at maintenance dosing.  She is tolerating allergy shots without large local or systemic reactions. She however does report issues with nasal drainage, for which she uses Atrovent and and has access to fluticasone nasal spray as well. She has noticed a decrease in the need for Atrovent.  Review of systems: 10pt ROS negative unless noted above in HPI  All other systems negative unless noted above in HPI  Past medical/social/surgical/family history have been reviewed and are unchanged unless specifically indicated below.  No changes  Medication List: Current Outpatient Medications  Medication Sig Dispense Refill   Albuterol-Budesonide  (AIRSUPRA) 90-80 MCG/ACT AERO INHALE 2 PUFFS INTO THE LUNGS 4 TIMES DAILY AS NEEDED. 10.7 g 1   Azelastine HCl 137 MCG/SPRAY SOLN USE 2 SPRAYS IN EACH NOSTRIL TWICE DAILY AS DIRECTED 30 mL 4   cyclobenzaprine (FLEXERIL) 10 MG tablet TAKE 1/2 TO 1 TABLET (5-10 MG TOTAL) BY MOUTH EVERY DAY AT BEDTIME 30 tablet 2   fluticasone (FLONASE) 50 MCG/ACT nasal spray Place 2 sprays into both nostrils daily. 16 g 5   ipratropium (ATROVENT) 0.06 % nasal spray Place 2 sprays into both nostrils 3 (three) times daily as needed for rhinitis. 15 mL 5   meloxicam (MOBIC) 15 MG tablet Take 1 tablet (15 mg total) by mouth daily. 30 tablet 5   norethindrone-ethinyl estradiol-FE (JUNEL FE 1/20) 1-20 MG-MCG tablet Take 1 tablet by mouth daily. 28 tablet 11   phentermine 30 MG capsule Take 1 capsule (30 mg total) by mouth every morning. 30 capsule 0   phentermine 30 MG capsule Take 1 capsule (30 mg total) by mouth every morning. 30 capsule 0   phentermine 30 MG capsule Take 1 capsule (30 mg total) by mouth every morning. 30 capsule 0   topiramate (TOPAMAX) 50 MG tablet Take 1 tablet (50 mg total) by mouth 2 (two) times daily. 180 tablet 1   EPINEPHrine (EPIPEN 2-PAK) 0.3 mg/0.3 mL IJ SOAJ injection Inject 0.3 mg into the muscle as needed for anaphylaxis. 2 each 1   fluticasone furoate-vilanterol (BREO ELLIPTA) 100-25 MCG/ACT AEPB Inhale 1 puff into the lungs daily. 60 each 5   ipratropium (ATROVENT) 0.06 % nasal spray Place 2  sprays each nostril in evening/bedtime and can use up to 3-4 times a day if needed 15 mL 5   levocetirizine (XYZAL) 5 MG tablet Take 1 tablet (5 mg total) by mouth daily as needed for allergies. 30 tablet 5   No current facility-administered medications for this visit.     Known medication allergies: Allergies  Allergen Reactions   Sulfa Antibiotics Anaphylaxis and Swelling   Vicodin [Hydrocodone-Acetaminophen] Other (See Comments)    Nightmares, visual disturbances     Physical  examination: Blood pressure 118/80, temperature (!) 97.2 F (36.2 C), temperature source Temporal, resp. rate 16, SpO2 98%.  General: Alert, interactive, in no acute distress. HEENT: PERRLA, TMs pearly gray, turbinates non-edematous without discharge, post-pharynx non erythematous. Neck: Supple without lymphadenopathy. Lungs: Clear to auscultation without wheezing, rhonchi or rales. {no increased work of breathing. CV: Normal S1, S2 without murmurs. Abdomen: Nondistended, nontender. Skin: Warm and dry, without lesions or rashes. Extremities:  No clubbing, cyanosis or edema. Neuro:   Grossly intact.  Diagnositics/Labs:  Spirometry: FEV1: 2.29L 105%, FVC: 2.62L 97%, ratio consistent with nonobstructive pattern  Assessment and plan: Allergic rhinitis with conjunctivitis  - continue avoidance measures for trees, weeds, grasses, molds, dust mites, cockroach  - continue nasal Atrovent 0.06% 2 sprays each nostril up to 3-4 times a day if needed for nasal drainage.    - continue your antihistamine supplement as needed.   - for itchy/watery/red eyes use Pazeo or Pataday 1 drop each eye daily as needed  - continue allergen immunotherapy per schedule.  At maintenance dosing and we discussed today likely will be able to discontinue shots once you are finished with your current vial.  Will reassess this summer and see if we can discontinue versus needing an extension  Reactive airway - Lung function testing looks great today! - continue Breo 1 puff once a day - have access to albuterol inhaler OR Airsupra 2 puffs every 4-6 hours as needed for cough/wheeze/shortness of breath/chest tightness.  May use 15-20 minutes prior to activity.   Monitor frequency of use.   Rebecca Simon Simon has albuterol and budesonide (steroid) in it and can provide longer lasting relief of symptoms with as needed use.  Control goals:  Full participation in all desired activities (may need albuterol before activity) Albuterol use  two time or less a week on average (not counting use with activity) Cough interfering with sleep two time or less a month Oral steroids no more than once a year No hospitalizations  Pollen food allergy syndrome  - tolerating carrots, apples and peaches.  This means that the allergy shots have helped to decrease your sensitivity to pollens.      Follow-up 6 months or sooner if needed  I appreciate the opportunity to take part in Alyria's care. Please do not hesitate to contact me with questions.  Sincerely,   Margo Aye, MD Allergy/Immunology [facility]

## 2023-05-08 NOTE — Patient Instructions (Addendum)
Allergies  - continue avoidance measures for trees, weeds, grasses, molds, dust mites, cockroach  - continue nasal Atrovent 0.06% 2 sprays each nostril up to 3-4 times a day if needed for nasal drainage.    - continue your antihistamine supplement as needed.   - for itchy/watery/red eyes use Pazeo or Pataday 1 drop each eye daily as needed  - continue allergen immunotherapy per schedule.  At maintenance dosing and we discussed today likely will be able to discontinue shots once you are finished with your current vial.  Will reassess this summer and see if we can discontinue versus needing an extension  Reactive airway - Lung function testing looks great today! - continue Breo 1 puff once a day - have access to albuterol inhaler OR Airsupra 2 puffs every 4-6 hours as needed for cough/wheeze/shortness of breath/chest tightness.  May use 15-20 minutes prior to activity.   Monitor frequency of use.   [name] has albuterol and budesonide (steroid) in it and can provide longer lasting relief of symptoms with as needed use.  Control goals:  Full participation in all desired activities (may need albuterol before activity) Albuterol use two time or less a week on average (not counting use with activity) Cough interfering with sleep two time or less a month Oral steroids no more than once a year No hospitalizations  Pollen food allergy syndrome  - tolerating carrots, apples and peaches.  This means that the allergy shots have helped to decrease your sensitivity to pollens.      Follow-up 6 months or sooner if needed

## 2023-05-12 ENCOUNTER — Telehealth: Payer: Self-pay | Admitting: *Deleted

## 2023-05-12 NOTE — Telephone Encounter (Signed)
-----   Message from Nurse Vonzell Schlatter sent at 05/09/2023  3:57 PM EST ----- Apolonio Schneiders can you take care of this please. ----- Message ----- From: Marcelyn Bruins, MD Sent: 05/08/2023   7:11 PM EST To: Ma Hillock, CMA; #  Please mark her allergy shots do not reorder.  Hopefully planning to discontinue once vials are complete.

## 2023-05-12 NOTE — Telephone Encounter (Signed)
Patient's allergy vials have been marked DNO.

## 2023-05-24 ENCOUNTER — Other Ambulatory Visit: Payer: Self-pay | Admitting: Family Medicine

## 2023-05-24 DIAGNOSIS — M545 Low back pain, unspecified: Secondary | ICD-10-CM

## 2023-06-04 ENCOUNTER — Encounter: Payer: Self-pay | Admitting: Family Medicine

## 2023-06-09 ENCOUNTER — Ambulatory Visit (INDEPENDENT_AMBULATORY_CARE_PROVIDER_SITE_OTHER): Payer: Self-pay

## 2023-06-09 DIAGNOSIS — J309 Allergic rhinitis, unspecified: Secondary | ICD-10-CM

## 2023-07-01 ENCOUNTER — Encounter: Payer: 59 | Admitting: Family Medicine

## 2023-07-04 ENCOUNTER — Encounter: Payer: Self-pay | Admitting: Family Medicine

## 2023-07-04 ENCOUNTER — Ambulatory Visit (INDEPENDENT_AMBULATORY_CARE_PROVIDER_SITE_OTHER): Admitting: Family Medicine

## 2023-07-04 VITALS — BP 100/58 | HR 84 | Temp 98.5°F | Ht 61.0 in | Wt 195.3 lb

## 2023-07-04 DIAGNOSIS — Z0000 Encounter for general adult medical examination without abnormal findings: Secondary | ICD-10-CM

## 2023-07-04 DIAGNOSIS — Z13220 Encounter for screening for lipoid disorders: Secondary | ICD-10-CM | POA: Diagnosis not present

## 2023-07-04 DIAGNOSIS — Z6838 Body mass index (BMI) 38.0-38.9, adult: Secondary | ICD-10-CM

## 2023-07-04 DIAGNOSIS — E66812 Obesity, class 2: Secondary | ICD-10-CM

## 2023-07-04 LAB — LIPID PANEL
Cholesterol: 166 mg/dL (ref 0–200)
HDL: 48.5 mg/dL (ref >39.00)
LDL Cholesterol: 103 mg/dL — ABNORMAL HIGH (ref 0–99)
NonHDL: 117.65
Total CHOL/HDL Ratio: 3
Triglycerides: 74 mg/dL (ref 0.0–149.0)
VLDL: 14.8 mg/dL (ref 0.0–40.0)

## 2023-07-04 MED ORDER — PHENTERMINE HCL 30 MG PO CAPS: 30.0000 mg | ORAL_CAPSULE | ORAL | 0 refills | Status: DC

## 2023-07-04 MED ORDER — PHENTERMINE HCL 30 MG PO CAPS
30.0000 mg | ORAL_CAPSULE | ORAL | 0 refills | Status: DC
Start: 2023-07-04 — End: 2023-10-10

## 2023-07-04 NOTE — Progress Notes (Signed)
 Complete physical exam  Patient: Rebecca Simon   DOB: 04-01-1976   48 y.o. Female  MRN: [id number]  Subjective:    Chief Complaint  Patient presents with   Annual Exam    Rebecca Simon is a 48 y.o. female who presents today for a complete physical exam. She reports consuming a general diet. The patient does not participate in regular exercise at present. She generally feels well. She reports sleeping fairly well. She does not have additional problems to discuss today.    Most recent fall risk assessment:     No data to display           Most recent depression screenings:    04/01/2023   10:28 AM 01/07/2023    3:46 PM  PHQ 2/9 Scores  PHQ - 2 Score 0 0  PHQ- 9 Score 0 2    Vision:Within last year and Dental: No current dental problems and Receives regular dental care  Patient Active Problem List   Diagnosis Date Noted   Elevated blood pressure reading 10/04/2022   Chronic low back pain without sciatica 04/02/2022   Mild intermittent asthma without complication 07/17/2021   Class 2 severe obesity due to excess calories with serious comorbidity and body mass index (BMI) of 37.0 to 37.9 in adult [facility]) 07/17/2021   Acute recurrent pansinusitis 07/17/2021   Sleep related headaches 07/17/2021   Non-restorative sleep 07/17/2021   Excessive daytime sleepiness 07/17/2021   Loud snoring 07/17/2021   Anti-TPO antibodies present 02/26/2021   Obesity (BMI 35.0-39.9 without comorbidity) 02/26/2021   DOE (dyspnea on exertion) 01/20/2018   Cough variant asthma  vs UACS  01/20/2018   Seasonal allergies 08/03/2014   Obesity 08/03/2014   GERD (gastroesophageal reflux disease) 08/03/2014      Patient Care Team: Karie Georges, MD as PCP - General (Family Medicine) Essie Hart, MD (Inactive) as Referring Physician (Obstetrics and Gynecology) Marcelyn Bruins, MD as Consulting Physician (Allergy)   Outpatient Medications Prior to Visit  Medication Sig    Albuterol-Budesonide (AIRSUPRA) 90-80 MCG/ACT AERO INHALE 2 PUFFS INTO THE LUNGS 4 TIMES DAILY AS NEEDED.   Azelastine HCl 137 MCG/SPRAY SOLN USE 2 SPRAYS IN EACH NOSTRIL TWICE DAILY AS DIRECTED   cyclobenzaprine (FLEXERIL) 10 MG tablet TAKE 1/2 TO 1 TABLET (5-10 MG TOTAL) BY MOUTH EVERY DAY AT BEDTIME   EPINEPHrine (EPIPEN 2-PAK) 0.3 mg/0.3 mL IJ SOAJ injection Inject 0.3 mg into the muscle as needed for anaphylaxis.   fluticasone (FLONASE) 50 MCG/ACT nasal spray Place 2 sprays into both nostrils daily.   fluticasone furoate-vilanterol (BREO ELLIPTA) 100-25 MCG/ACT AEPB Inhale 1 puff into the lungs daily.   ipratropium (ATROVENT) 0.06 % nasal spray Place 2 sprays into both nostrils 3 (three) times daily as needed for rhinitis.   ipratropium (ATROVENT) 0.06 % nasal spray Place 2 sprays each nostril in evening/bedtime and can use up to 3-4 times a day if needed   levocetirizine (XYZAL) 5 MG tablet Take 1 tablet (5 mg total) by mouth daily as needed for allergies.   meloxicam (MOBIC) 15 MG tablet Take 1 tablet (15 mg total) by mouth daily.   norethindrone-ethinyl estradiol-FE (JUNEL FE 1/20) 1-20 MG-MCG tablet Take 1 tablet by mouth daily.   topiramate (TOPAMAX) 50 MG tablet Take 1 tablet (50 mg total) by mouth 2 (two) times daily.   [DISCONTINUED] phentermine 30 MG capsule Take 1 capsule (30 mg total) by mouth every morning.   [DISCONTINUED] phentermine 30 MG capsule  Take 1 capsule (30 mg total) by mouth every morning.   [DISCONTINUED] phentermine 30 MG capsule Take 1 capsule (30 mg total) by mouth every morning.   No facility-administered medications prior to visit.    Review of Systems  HENT:  Negative for hearing loss.   Eyes:  Negative for blurred vision.  Respiratory:  Negative for shortness of breath.   Cardiovascular:  Negative for chest pain.  Gastrointestinal: Negative.   Genitourinary: Negative.   Musculoskeletal:  Negative for back pain.  Neurological:  Negative for headaches.   Psychiatric/Behavioral:  Negative for depression.        Objective:     BP (!) 100/58   Pulse 84   Temp 98.5 F (36.9 C) (Oral)   Ht 5\' 1"  (1.549 m)   Wt 195 lb 4.8 oz (88.6 kg)   SpO2 98%   BMI 36.90 kg/m    Physical Exam Vitals reviewed.  Constitutional:      Appearance: Normal appearance. She is well-groomed. She is obese.  HENT:     Right Ear: Tympanic membrane and ear canal normal.     Left Ear: Tympanic membrane and ear canal normal.     Mouth/Throat:     Mouth: Mucous membranes are moist.     Pharynx: No posterior oropharyngeal erythema.  Eyes:     Conjunctiva/sclera: Conjunctivae normal.  Neck:     Thyroid: No thyromegaly.  Cardiovascular:     Rate and Rhythm: Normal rate and regular rhythm.     Pulses: Normal pulses.     Heart sounds: S1 normal and S2 normal.  Pulmonary:     Effort: Pulmonary effort is normal.     Breath sounds: Normal breath sounds and air entry.  Abdominal:     General: Abdomen is flat. Bowel sounds are normal.     Palpations: Abdomen is soft.  Musculoskeletal:     Right lower leg: No edema.     Left lower leg: No edema.  Lymphadenopathy:     Cervical: No cervical adenopathy.  Neurological:     Mental Status: She is alert and oriented to person, place, and time. Mental status is at baseline.     Gait: Gait is intact.  Psychiatric:        Mood and Affect: Mood and affect normal.        Speech: Speech normal.        Behavior: Behavior normal.        Judgment: Judgment normal.      No results found for any visits on 07/04/23.     Assessment & Plan:    Routine Health Maintenance and Physical Exam  Immunization History  Administered Date(s) Administered   PFIZER(Purple Top)SARS-COV-2 Vaccination 07/19/2019, 08/10/2019   Tdap 08/03/2014    Health Maintenance  Topic Date Due   Pneumococcal Vaccine 36-30 Years old (1 of 2 - PCV) Never done   COVID-19 Vaccine (3 - 2024-25 season) 12/15/2022   INFLUENZA VACCINE  07/14/2023  (Originally 11/14/2022)   Cervical Cancer Screening (HPV/Pap Cotest)  11/07/2023   DTaP/Tdap/Td (2 - Td or Tdap) 08/02/2024   Colonoscopy  09/01/2030   Hepatitis C Screening  Completed   HIV Screening  Addressed   HPV VACCINES  Aged Out    Discussed health benefits of physical activity, and encouraged her to engage in regular exercise appropriate for her age and condition.  Lipid screening -     Lipid panel; Future  Class 2 severe obesity due to excess calories  with serious comorbidity and body mass index (BMI) of 38.0 to 38.9 in adult [facility]) -     Phentermine HCl; Take 1 capsule (30 mg total) by mouth every morning.  Dispense: 30 capsule; Refill: 0 -     Phentermine HCl; Take 1 capsule (30 mg total) by mouth every morning.  Dispense: 30 capsule; Refill: 0 -     Phentermine HCl; Take 1 capsule (30 mg total) by mouth every morning.  Dispense: 30 capsule; Refill: 0  Routine general medical examination at a health care facility  Normal physical exam findings, I counseled the patient on ear wax management and also counseled her on increasing exercise to at least 30 minutes 5 days per week. Handouts given on healthy eating and exercise. Lipid panel ordered for surveillance.   Return in about 3 months (around 10/04/2023) for weight loss.     Karie Georges, MD

## 2023-07-04 NOTE — Patient Instructions (Addendum)
 [email or web address] might have savings cards to use?   Debrox ear drops 2-3 drops each ear twice a week to prevent wax buildup Health Maintenance, Female Adopting a healthy lifestyle and getting preventive care are important in promoting health and wellness. Ask your health care provider about: The right schedule for you to have regular tests and exams. Things you can do on your own to prevent diseases and keep yourself healthy. What should I know about diet, weight, and exercise? Eat a healthy diet  Eat a diet that includes plenty of vegetables, fruits, low-fat dairy products, and lean protein. Do not eat a lot of foods that are high in solid fats, added sugars, or sodium. Maintain a healthy weight Body mass index (BMI) is used to identify weight problems. It estimates body fat based on height and weight. Your health care provider can help determine your BMI and help you achieve or maintain a healthy weight. Get regular exercise Get regular exercise. This is one of the most important things you can do for your health. Most adults should: Exercise for at least 150 minutes each week. The exercise should increase your heart rate and make you sweat (moderate-intensity exercise). Do strengthening exercises at least twice a week. This is in addition to the moderate-intensity exercise. Spend less time sitting. Even light physical activity can be beneficial. Watch cholesterol and blood lipids Have your blood tested for lipids and cholesterol at 48 years of age, then have this test every 5 years. Have your cholesterol levels checked more often if: Your lipid or cholesterol levels are high. You are older than 48 years of age. You are at high risk for heart disease. What should I know about cancer screening? Depending on your health history and family history, you may need to have cancer screening at various ages. This may include screening for: Breast cancer. Cervical cancer. Colorectal cancer. Skin  cancer. Lung cancer. What should I know about heart disease, diabetes, and high blood pressure? Blood pressure and heart disease High blood pressure causes heart disease and increases the risk of stroke. This is more likely to develop in people who have high blood pressure readings or are overweight. Have your blood pressure checked: Every 3-5 years if you are 4-87 years of age. Every year if you are 72 years old or older. Diabetes Have regular diabetes screenings. This checks your fasting blood sugar level. Have the screening done: Once every three years after age 59 if you are at a normal weight and have a low risk for diabetes. More often and at a younger age if you are overweight or have a high risk for diabetes. What should I know about preventing infection? Hepatitis B If you have a higher risk for hepatitis B, you should be screened for this virus. Talk with your health care provider to find out if you are at risk for hepatitis B infection. Hepatitis C Testing is recommended for: Everyone born from [birth year] through 1965. Anyone with known risk factors for hepatitis C. Sexually transmitted infections (STIs) Get screened for STIs, including gonorrhea and chlamydia, if: You are sexually active and are younger than 48 years of age. You are older than 48 years of age and your health care provider tells you that you are at risk for this type of infection. Your sexual activity has changed since you were last screened, and you are at increased risk for chlamydia or gonorrhea. Ask your health care provider if you are at risk. Ask your health care  provider about whether you are at high risk for HIV. Your health care provider may recommend a prescription medicine to help prevent HIV infection. If you choose to take medicine to prevent HIV, you should first get tested for HIV. You should then be tested every 3 months for as long as you are taking the medicine. Pregnancy If you are about to stop  having your period (premenopausal) and you may become pregnant, seek counseling before you get pregnant. Take 400 to 800 micrograms (mcg) of folic acid every day if you become pregnant. Ask for birth control (contraception) if you want to prevent pregnancy. Osteoporosis and menopause Osteoporosis is a disease in which the bones lose minerals and strength with aging. This can result in bone fractures. If you are 38 years old or older, or if you are at risk for osteoporosis and fractures, ask your health care provider if you should: Be screened for bone loss. Take a calcium or vitamin D supplement to lower your risk of fractures. Be given hormone replacement therapy (HRT) to treat symptoms of menopause. Follow these instructions at home: Alcohol use Do not drink alcohol if: Your health care provider tells you not to drink. You are pregnant, may be pregnant, or are planning to become pregnant. If you drink alcohol: Limit how much you have to: 0-1 drink a day. Know how much alcohol is in your drink. In the [place] one drink equals one 12 oz bottle of beer (355 mL), one 5 oz glass of wine (148 mL), or one 1 oz glass of hard liquor (44 mL). Lifestyle Do not use any products that contain nicotine or tobacco. These products include cigarettes, chewing tobacco, and vaping devices, such as e-cigarettes. If you need help quitting, ask your health care provider. Do not use street drugs. Do not share needles. Ask your health care provider for help if you need support or information about quitting drugs. General instructions Schedule regular health, dental, and eye exams. Stay current with your vaccines. Tell your health care provider if: You often feel depressed. You have ever been abused or do not feel safe at home. Summary Adopting a healthy lifestyle and getting preventive care are important in promoting health and wellness. Follow your health care provider's instructions about healthy diet,  exercising, and getting tested or screened for diseases. Follow your health care provider's instructions on monitoring your cholesterol and blood pressure. This information is not intended to replace advice given to you by your health care provider. Make sure you discuss any questions you have with your health care provider. Document Revised: 08/21/2020 Document Reviewed: 08/21/2020 Elsevier Patient Education  2024 [employer].

## 2023-07-07 ENCOUNTER — Ambulatory Visit (INDEPENDENT_AMBULATORY_CARE_PROVIDER_SITE_OTHER): Payer: Self-pay | Admitting: *Deleted

## 2023-07-07 DIAGNOSIS — J309 Allergic rhinitis, unspecified: Secondary | ICD-10-CM

## 2023-07-08 ENCOUNTER — Encounter: Payer: Self-pay | Admitting: Family Medicine

## 2023-07-15 ENCOUNTER — Telehealth: Payer: Self-pay

## 2023-07-15 NOTE — Telephone Encounter (Signed)
 Copied from CRM [id number]. Topic: Referral - Prior Authorization Question >> Jul 15, 2023  1:28 PM Gurney Maxin H wrote: Reason for CRM: Patient is calling stating she wants to go back to the LO LOESTRIN FE 1 MG-10 MCG / 10 MCG tablet and insurance needs a prior authorization, patient states she doesn't like the norethindrone-ethinyl estradiol-FE (JUNEL FE 1/20) 1-20 MG-MCG tablet states it doesn't do anything for her.  Sharlet Salina [phone]

## 2023-07-15 NOTE — Telephone Encounter (Signed)
 Spoke with the patient and informed her of the message below. Stated she talked with someone at her insurance and was told to have PCP get an appeal.  Message sent to PCP.

## 2023-07-15 NOTE — Telephone Encounter (Signed)
 What does she mean exactly? Since the PA was denied previously there is a coupon online on the [employer] that she can download and use this at the pharmacy instead.

## 2023-07-15 NOTE — Telephone Encounter (Signed)
 Message sent to PCP as prior auth was previously submitted by PA team and denied by insurance

## 2023-07-16 NOTE — Telephone Encounter (Signed)
 Did she say why she didn't like the other one I prescribed? I need a reason for the appeal -- side effects or something more specific other than she didn't like it.

## 2023-07-17 NOTE — Telephone Encounter (Signed)
 I spoke with the patient and she reported she has been experiencing lower back pain, Heavy menstrual bleeding and cramps. Patient reported she did get a coupon for prescription and it was only a $20 discount for a total price of $180. Patient was informed to contact insurance for number for appeal for prescription

## 2023-07-17 NOTE — Telephone Encounter (Signed)
Left a message for the patient to return my call,

## 2023-07-18 ENCOUNTER — Telehealth: Payer: Self-pay

## 2023-07-18 ENCOUNTER — Other Ambulatory Visit (HOSPITAL_COMMUNITY): Payer: Self-pay

## 2023-07-18 DIAGNOSIS — Z3041 Encounter for surveillance of contraceptive pills: Secondary | ICD-10-CM

## 2023-07-18 NOTE — Telephone Encounter (Signed)
 Pharmacy Patient Advocate Encounter   Received notification from Pt Calls Messages that prior authorization for Lo Loestrin Fe 1 MG-10 MCG /10 MCG tablets is required/requested.   Insurance verification completed.   The patient is insured through [employer] .   Per test claim: PA required; PA submitted to above mentioned insurance via CoverMyMeds Key/confirmation #/EOC [id number] Status is pending

## 2023-07-18 NOTE — Telephone Encounter (Signed)
 Ok! Please send this information to the prior auth team

## 2023-07-18 NOTE — Telephone Encounter (Signed)
 PA request has been Submitted. New Encounter has been or will be created for follow up. For additional info see Pharmacy Prior Auth telephone encounter from 07/18/23.

## 2023-07-21 ENCOUNTER — Telehealth: Payer: Self-pay

## 2023-07-21 MED ORDER — AUROVELA 24 FE 1-20 MG-MCG(24) PO TABS: 1.0000 | ORAL_TABLET | Freq: Every day | ORAL | 3 refills | Status: DC

## 2023-07-21 NOTE — Telephone Encounter (Signed)
 Script sent

## 2023-07-21 NOTE — Telephone Encounter (Signed)
 Noted.

## 2023-07-21 NOTE — Telephone Encounter (Signed)
 I spoke with the patient and she is willing to start alternative. Patient stated she would like prescription that is most comparable to her current contraceptive medication.

## 2023-07-21 NOTE — Addendum Note (Signed)
 Addended by: Karie Georges on: 07/21/2023 03:29 PM   Modules accepted: Orders

## 2023-07-21 NOTE — Telephone Encounter (Signed)
 Copied from CRM [id number]. Topic: General - Other >> Jul 21, 2023  2:31 PM Taleah C wrote: Reason for CRM: pt called to speak with Mikel regarding a prior auth. I called the CAL and was informed that he was with another patient. Please callback and advise.

## 2023-07-21 NOTE — Telephone Encounter (Signed)
 Pharmacy Patient Advocate Encounter  Received notification from [employer]  that Prior Authorization for Lo Loestrin Fe 1 MG-10 MCG /10 MCG tablets has been DENIED.  Full denial letter will be uploaded to the media tab. See denial reason below.   PA #/Case ID/Reference #: [id number]

## 2023-07-21 NOTE — Telephone Encounter (Signed)
 Would she be willing to try one of the OCP's listed above?

## 2023-08-07 ENCOUNTER — Ambulatory Visit (INDEPENDENT_AMBULATORY_CARE_PROVIDER_SITE_OTHER): Payer: Self-pay

## 2023-08-07 DIAGNOSIS — J309 Allergic rhinitis, unspecified: Secondary | ICD-10-CM | POA: Diagnosis not present

## 2023-09-11 ENCOUNTER — Ambulatory Visit (INDEPENDENT_AMBULATORY_CARE_PROVIDER_SITE_OTHER)

## 2023-09-11 ENCOUNTER — Other Ambulatory Visit: Payer: Self-pay | Admitting: Family Medicine

## 2023-09-11 DIAGNOSIS — J309 Allergic rhinitis, unspecified: Secondary | ICD-10-CM

## 2023-09-19 ENCOUNTER — Other Ambulatory Visit: Payer: Self-pay | Admitting: Family Medicine

## 2023-09-19 DIAGNOSIS — M545 Other chronic pain: Secondary | ICD-10-CM

## 2023-10-08 ENCOUNTER — Other Ambulatory Visit (HOSPITAL_COMMUNITY): Payer: Self-pay

## 2023-10-08 ENCOUNTER — Telehealth: Payer: Self-pay

## 2023-10-08 NOTE — Telephone Encounter (Signed)
 Clinical questions populated and submitted-pending determination

## 2023-10-08 NOTE — Telephone Encounter (Signed)
*  AA  Pharmacy Patient Advocate Encounter   Received notification from Fax that prior authorization for Airsupra  90-80MCG/ACT aerosol  is required/requested.   Insurance verification completed.   The patient is insured through [facility] .   Per test claim: PA required; PA started via CoverMyMeds. KEY [id number] . Waiting for clinical questions to populate.

## 2023-10-08 NOTE — Telephone Encounter (Signed)
 Approved. AIRSUPRA  90/80/MCG/ACT Aerosol is approved from 10/08/2023 to 10/07/2024. All strengths of the drug are approved.

## 2023-10-10 ENCOUNTER — Ambulatory Visit: Admitting: Family Medicine

## 2023-10-10 ENCOUNTER — Encounter: Payer: Self-pay | Admitting: Family Medicine

## 2023-10-10 ENCOUNTER — Ambulatory Visit (INDEPENDENT_AMBULATORY_CARE_PROVIDER_SITE_OTHER)

## 2023-10-10 VITALS — BP 128/88 | HR 76 | Temp 98.6°F | Ht 61.0 in | Wt 189.5 lb

## 2023-10-10 DIAGNOSIS — E66812 Obesity, class 2: Secondary | ICD-10-CM

## 2023-10-10 DIAGNOSIS — J309 Allergic rhinitis, unspecified: Secondary | ICD-10-CM | POA: Diagnosis not present

## 2023-10-10 DIAGNOSIS — Z3041 Encounter for surveillance of contraceptive pills: Secondary | ICD-10-CM | POA: Diagnosis not present

## 2023-10-10 DIAGNOSIS — Z6838 Body mass index (BMI) 38.0-38.9, adult: Secondary | ICD-10-CM

## 2023-10-10 MED ORDER — PHENTERMINE HCL 30 MG PO CAPS: 30.0000 mg | ORAL_CAPSULE | ORAL | 0 refills | Status: DC

## 2023-10-10 MED ORDER — LO LOESTRIN FE 1 MG-10 MCG / 10 MCG PO TABS: 1.0000 | ORAL_TABLET | Freq: Every evening | ORAL | 11 refills | Status: AC

## 2023-10-10 NOTE — Progress Notes (Unsigned)
 Established Patient Office Visit  Subjective   Patient ID: Rebecca Simon, female    DOB: 04-14-1976  Age: 48 y.o. MRN: [id number]  Chief Complaint  Patient presents with  . Medical Management of Chronic Issues    Pt is here for follow up, states that she is having a good response  to the medication, is getting good appetite control, no side effects from the medication, also still taking the topiramate , feels like it is helping with her appetite control.   Cramping with periods- pt reports that the current OCP she is on is not working for her, is still having periods even though she is on the 3 month cycle. States that the cramping and low back pain is back, not working well, she would like to go back to the Lo loestrin . HAS TRIED 2 ALTERNATIVE OCPS' AND HAS FAILED BOTH.    Current Outpatient Medications  Medication Instructions  . Albuterol -Budesonide  (AIRSUPRA ) 90-80 MCG/ACT AERO INHALE 2 PUFFS INTO THE LUNGS 4 TIMES DAILY AS NEEDED.  . Azelastine  HCl 137 MCG/SPRAY SOLN USE 2 SPRAYS IN EACH NOSTRIL TWICE DAILY AS DIRECTED  . EPINEPHrine  (EPIPEN  2-PAK) 0.3 mg, Intramuscular, As needed  . fluticasone  furoate-vilanterol (BREO ELLIPTA ) 100-25 MCG/ACT AEPB 1 puff, Inhalation, Daily  . ipratropium (ATROVENT ) 0.06 % nasal spray 2 sprays, Each Nare, 3 times daily PRN  . ipratropium (ATROVENT ) 0.06 % nasal spray Place 2 sprays each nostril in evening/bedtime and can use up to 3-4 times a day if needed  . levocetirizine (XYZAL ) 5 mg, Oral, Daily PRN  . meloxicam  (MOBIC ) 15 mg, Oral, Daily  . Norethindrone-Ethinyl Estradiol-Fe Biphas (LO LOESTRIN FE ) 1 MG-10 MCG / 10 MCG tablet 1 tablet, Oral, Every evening  . phentermine  30 mg, Oral, BH-each morning  . [START ON 11/04/2023] phentermine  30 mg, Oral, BH-each morning  . [START ON 12/01/2023] phentermine  30 mg, Oral, BH-each morning  . topiramate  (TOPAMAX ) 50 mg, Oral, 2 times daily    {History (Optional):23778}  Review of Systems  All other  systems reviewed and are negative.     Objective:     BP 128/88   Pulse 76   Temp 98.6 F (37 C) (Oral)   Ht 5' 1 (1.549 m)   Wt 189 lb 8 oz (86 kg)   LMP 09/12/2023 (Exact Date)   SpO2 98%   BMI 35.81 kg/m  {Vitals History (Optional):23777}  Physical Exam Vitals reviewed.  Constitutional:      Appearance: Normal appearance. She is well-groomed. She is obese.   Eyes:     Conjunctiva/sclera: Conjunctivae normal.    Cardiovascular:     Rate and Rhythm: Normal rate and regular rhythm.     Pulses: Normal pulses.     Heart sounds: S1 normal and S2 normal.  Pulmonary:     Effort: Pulmonary effort is normal.     Breath sounds: Normal breath sounds and air entry.  Abdominal:     General: Bowel sounds are normal.   Musculoskeletal:     Right lower leg: No edema.     Left lower leg: No edema.   Neurological:     Mental Status: She is alert and oriented to person, place, and time. Mental status is at baseline.     Gait: Gait is intact.   Psychiatric:        Mood and Affect: Mood and affect normal.        Speech: Speech normal.        Behavior: Behavior  normal.        Judgment: Judgment normal.     No results found for any visits on 10/10/23.  {Labs (Optional):23779}  The 10-year ASCVD risk score (Arnett DK, et al., 2019) is: 1.6%    Assessment & Plan:  Class 2 severe obesity due to excess calories with serious comorbidity and body mass index (BMI) of 38.0 to 38.9 in adult [facility]) -     Phentermine  HCl; Take 1 capsule (30 mg total) by mouth every morning.  Dispense: 30 capsule; Refill: 0 -     Phentermine  HCl; Take 1 capsule (30 mg total) by mouth every morning.  Dispense: 30 capsule; Refill: 0 -     Phentermine  HCl; Take 1 capsule (30 mg total) by mouth every morning.  Dispense: 30 capsule; Refill: 0  Encounter for surveillance of contraceptive pills -     Lo Loestrin Fe ; Take 1 tablet by mouth every evening.  Dispense: 28 tablet; Refill: 11     Return in  about 3 months (around 01/10/2024) for weight loss.    Heron CHRISTELLA Sharper, MD

## 2023-10-13 NOTE — Assessment & Plan Note (Signed)
 Pt has lost 6 pounds since last visit, weight is trending down, I reinforced dietary goals and refilled the phentermine , no side effects reported today

## 2023-11-01 ENCOUNTER — Other Ambulatory Visit: Payer: Self-pay | Admitting: Allergy

## 2023-11-05 ENCOUNTER — Encounter: Payer: Self-pay | Admitting: Allergy

## 2023-11-05 ENCOUNTER — Other Ambulatory Visit: Payer: Self-pay

## 2023-11-05 ENCOUNTER — Ambulatory Visit (INDEPENDENT_AMBULATORY_CARE_PROVIDER_SITE_OTHER): Payer: No Typology Code available for payment source | Admitting: Allergy

## 2023-11-05 VITALS — BP 122/84 | HR 81 | Temp 97.7°F | Resp 18 | Ht 61.02 in | Wt 188.0 lb

## 2023-11-05 DIAGNOSIS — J4540 Moderate persistent asthma, uncomplicated: Secondary | ICD-10-CM | POA: Diagnosis not present

## 2023-11-05 DIAGNOSIS — T781XXD Other adverse food reactions, not elsewhere classified, subsequent encounter: Secondary | ICD-10-CM | POA: Diagnosis not present

## 2023-11-05 DIAGNOSIS — J3089 Other allergic rhinitis: Secondary | ICD-10-CM | POA: Diagnosis not present

## 2023-11-05 DIAGNOSIS — H1013 Acute atopic conjunctivitis, bilateral: Secondary | ICD-10-CM | POA: Diagnosis not present

## 2023-11-05 DIAGNOSIS — J309 Allergic rhinitis, unspecified: Secondary | ICD-10-CM

## 2023-11-05 MED ORDER — AIRSUPRA 90-80 MCG/ACT IN AERO: 2.0000 | INHALATION_SPRAY | RESPIRATORY_TRACT | 1 refills | Status: AC | PRN

## 2023-11-05 NOTE — Patient Instructions (Addendum)
 Allergies  - continue avoidance measures for trees, weeds, grasses, molds, dust mites, cockroach  - continue nasal Atrovent  0.06% 2 sprays each nostril up to 3-4 times a day if needed for nasal drainage.    - continue your antihistamine supplement as needed.   - for itchy/watery/red eyes use Pazeo or Pataday  1 drop each eye daily as needed  - continue allergen immunotherapy per schedule.  At maintenance dosing.  Will reorder your vials and plan to graduate from shots after completion of these vials.    Reactive airway - Lung function testing looks great today! - continue Breo 1 puff once a day - have access to albuterol  inhaler OR Airsupra  2 puffs every 4-6 hours as needed for cough/wheeze/shortness of breath/chest tightness.  May use 15-20 minutes prior to activity.   Monitor frequency of use.   Airsupra  has albuterol  and budesonide  (steroid) in it and can provide longer lasting relief of symptoms with as needed use.  Control goals:  Full participation in all desired activities (may need albuterol  before activity) Albuterol  use two time or less a week on average (not counting use with activity) Cough interfering with sleep two time or less a month Oral steroids no more than once a year No hospitalizations  Pollen food allergy  syndrome  - tolerating carrots, apples and peaches.  This means that the allergy  shots have helped to decrease your sensitivity to pollens.      Follow-up 6 months or sooner if needed

## 2023-11-05 NOTE — Progress Notes (Signed)
 Follow-up Note  RE: Rebecca Simon. Dollens MRN: [id number] DOB: 06-04-1975 Date of Office Visit: 11/05/2023   History of present illness: Rebecca Simon is a 48 y.o. female presenting today for follow-up of an allergic rhinitis with conjunctivitis, reactive airway, oral allergy  syndrome.  She was last seen in the office on 05/08/2023 by myself. Discussed the use of AI scribe software for clinical note transcription with the patient, who gave verbal consent to proceed.  This spring, she experienced some increased allergy  and respiratory symptoms. This led to more frequent use of her inhaler and allergy  medications.  In recent weeks, her symptoms have improved, allowing her to use allergy  medications, including pills (antihistamine supplement) and spray (Atrovent ), on an as-needed basis. She currently takes her allergy  pill about three to four times a week and has not used her inhaler since last week. She continues to use Breo 1 puff daily and Airsupra  for rescue purposes. She has been doing well with immunotherapy and is at maintenance dosing without any large local or systemic reactions.  She definitely can tell that they have worked as her allergy  symptoms have improved over the years.  She has access to an epinephrine  device while on allergy  shots.  Review of systems: 10pt ROS negative unless noted above in HPI  Past medical/social/surgical/family history have been reviewed and are unchanged unless specifically indicated below.  No changes  Medication List: Current Outpatient Medications  Medication Sig Dispense Refill   Albuterol -Budesonide  (AIRSUPRA ) 90-80 MCG/ACT AERO INHALE 2 PUFFS INTO THE LUNGS 4 TIMES DAILY AS NEEDED. 10.7 g 1   Azelastine  HCl 137 MCG/SPRAY SOLN USE 2 SPRAYS IN EACH NOSTRIL TWICE DAILY AS DIRECTED 30 mL 4   EPINEPHrine  (EPIPEN  2-PAK) 0.3 mg/0.3 mL IJ SOAJ injection Inject 0.3 mg into the muscle as needed for anaphylaxis. 2 each 1   fluticasone  furoate-vilanterol  (BREO ELLIPTA ) 100-25 MCG/ACT AEPB Inhale 1 puff into the lungs daily. 60 each 5   ipratropium (ATROVENT ) 0.06 % nasal spray Place 2 sprays into both nostrils 3 (three) times daily as needed for rhinitis. 15 mL 5   ipratropium (ATROVENT ) 0.06 % nasal spray PLACE 2 SPRAYS EACH NOSTRIL IN EVENING/BEDTIME AND CAN USE UP TO 3-4 TIMES A DAY IF NEEDED 45 mL 0   levocetirizine (XYZAL ) 5 MG tablet Take 1 tablet (5 mg total) by mouth daily as needed for allergies. 30 tablet 5   meloxicam  (MOBIC ) 15 MG tablet TAKE 1 TABLET (15 MG TOTAL) BY MOUTH DAILY. 90 tablet 0   Norethindrone-Ethinyl Estradiol-Fe Biphas (LO LOESTRIN FE ) 1 MG-10 MCG / 10 MCG tablet Take 1 tablet by mouth every evening. 28 tablet 11   phentermine  30 MG capsule Take 1 capsule (30 mg total) by mouth every morning. 30 capsule 0   phentermine  30 MG capsule Take 1 capsule (30 mg total) by mouth every morning. 30 capsule 0   [START ON 12/01/2023] phentermine  30 MG capsule Take 1 capsule (30 mg total) by mouth every morning. 30 capsule 0   topiramate  (TOPAMAX ) 50 MG tablet TAKE 1 TABLET BY MOUTH TWICE A DAY 60 tablet 5   No current facility-administered medications for this visit.     Known medication allergies: Allergies  Allergen Reactions   Sulfa Antibiotics Anaphylaxis and Swelling   Vicodin [Hydrocodone-Acetaminophen ] Other (See Comments)    Nightmares, visual disturbances     Physical examination: Blood pressure 122/84, pulse 81, temperature 97.7 F (36.5 C), temperature source Temporal, resp. rate 18, height 5' 1.02 (  1.55 m), weight 188 lb (85.3 kg), last menstrual period 09/12/2023, SpO2 100%.  General: Alert, interactive, in no acute distress. HEENT: PERRLA, TMs pearly gray, turbinates non-edematous without discharge, post-pharynx non erythematous. Neck: Supple without lymphadenopathy. Lungs: Clear to auscultation without wheezing, rhonchi or rales. {no increased work of breathing. CV: Normal S1, S2 without  murmurs. Abdomen: Nondistended, nontender. Skin: Very fine flesh-colored papules on the forearm. Extremities:  No clubbing, cyanosis or edema. Neuro:   Grossly intact.  Diagnostics/Labs:  Spirometry: FEV1: 2.16L 99%, FVC: 2.63L 98%, ratio consistent with obstructive pattern  Assessment and plan: Allergic rhinitis with conjunctivitis  - continue avoidance measures for trees, weeds, grasses, molds, dust mites, cockroach  - continue nasal Atrovent  0.06% 2 sprays each nostril up to 3-4 times a day if needed for nasal drainage.    - continue your antihistamine supplement as needed.   - for itchy/watery/red eyes use Pazeo or Pataday  1 drop each eye daily as needed  - continue allergen immunotherapy per schedule.  At maintenance dosing.  Will reorder your vials and plan to graduate from shots after completion of these vials.    Reactive airway - Lung function testing looks great today! - continue Breo 1 puff once a day - have access to albuterol  inhaler OR Airsupra  2 puffs every 4-6 hours as needed for cough/wheeze/shortness of breath/chest tightness.  May use 15-20 minutes prior to activity.   Monitor frequency of use.   Airsupra  has albuterol  and budesonide  (steroid) in it and can provide longer lasting relief of symptoms with as needed use.  Control goals:  Full participation in all desired activities (may need albuterol  before activity) Albuterol  use two time or less a week on average (not counting use with activity) Cough interfering with sleep two time or less a month Oral steroids no more than once a year No hospitalizations  Pollen food allergy  syndrome  - tolerating carrots, apples and peaches.  This means that the allergy  shots have helped to decrease your sensitivity to pollens.      Follow-up 6 months or sooner if needed  I appreciate the opportunity to take part in Teruko's care. Please do not hesitate to contact me with questions.  Sincerely,   Danita Brain,  MD Allergy /Immunology [facility]

## 2023-12-06 ENCOUNTER — Other Ambulatory Visit: Payer: Self-pay | Admitting: Allergy

## 2023-12-16 ENCOUNTER — Ambulatory Visit (INDEPENDENT_AMBULATORY_CARE_PROVIDER_SITE_OTHER)

## 2023-12-16 DIAGNOSIS — J309 Allergic rhinitis, unspecified: Secondary | ICD-10-CM

## 2023-12-20 ENCOUNTER — Other Ambulatory Visit: Payer: Self-pay | Admitting: Family Medicine

## 2023-12-20 DIAGNOSIS — G8929 Other chronic pain: Secondary | ICD-10-CM

## 2024-01-12 ENCOUNTER — Encounter: Payer: Self-pay | Admitting: Family Medicine

## 2024-01-12 ENCOUNTER — Ambulatory Visit: Admitting: Family Medicine

## 2024-01-12 VITALS — BP 120/68 | HR 75 | Temp 98.0°F | Ht 61.0 in | Wt 189.6 lb

## 2024-01-12 DIAGNOSIS — E66812 Obesity, class 2: Secondary | ICD-10-CM

## 2024-01-12 DIAGNOSIS — Z6837 Body mass index (BMI) 37.0-37.9, adult: Secondary | ICD-10-CM | POA: Diagnosis not present

## 2024-01-12 DIAGNOSIS — Z6838 Body mass index (BMI) 38.0-38.9, adult: Secondary | ICD-10-CM

## 2024-01-12 LAB — COMPREHENSIVE METABOLIC PANEL WITH GFR
ALT: 16 U/L (ref 0–35)
AST: 17 U/L (ref 0–37)
Albumin: 3.8 g/dL (ref 3.5–5.2)
Alkaline Phosphatase: 84 U/L (ref 39–117)
BUN: 12 mg/dL (ref 6–23)
CO2: 25 meq/L (ref 19–32)
Calcium: 8.8 mg/dL (ref 8.4–10.5)
Chloride: 107 meq/L (ref 96–112)
Creatinine, Ser: 1.01 mg/dL (ref 0.40–1.20)
GFR: 65.76 mL/min (ref >60.00)
Glucose, Bld: 93 mg/dL (ref 70–99)
Potassium: 4.2 meq/L (ref 3.5–5.1)
Sodium: 138 meq/L (ref 135–145)
Total Bilirubin: 0.4 mg/dL (ref 0.2–1.2)
Total Protein: 7.4 g/dL (ref 6.0–8.3)

## 2024-01-12 LAB — TSH: TSH: 1.46 u[IU]/mL (ref 0.35–5.50)

## 2024-01-12 MED ORDER — PHENTERMINE HCL 30 MG PO CAPS: 30.0000 mg | ORAL_CAPSULE | ORAL | 0 refills | Status: DC

## 2024-01-12 NOTE — Progress Notes (Unsigned)
 Established Patient Office Visit  Subjective   Patient ID: Rebecca Simon, female    DOB: [birth year]  Age: 48 y.o. MRN: [id number]  Chief Complaint  Patient presents with  . Medical Management of Chronic Issues    HPI   Current Outpatient Medications  Medication Instructions  . Albuterol -Budesonide  (AIRSUPRA ) 90-80 MCG/ACT AERO 2 puffs, Inhalation, Every 4 hours PRN  . Azelastine  HCl 137 MCG/SPRAY SOLN USE 2 SPRAYS IN EACH NOSTRIL TWICE DAILY AS DIRECTED  . EPINEPHrine  (EPIPEN  2-PAK) 0.3 mg, Intramuscular, As needed  . fluticasone  furoate-vilanterol (BREO ELLIPTA ) 100-25 MCG/ACT AEPB 1 puff, Inhalation, Daily  . ipratropium (ATROVENT ) 0.06 % nasal spray 2 sprays, Each Nare, 3 times daily PRN  . ipratropium (ATROVENT ) 0.06 % nasal spray PLACE 2 SPRAYS EACH NOSTRIL IN EVENING/BEDTIME AND CAN USE UP TO 3-4 TIMES A DAY IF NEEDED  . levocetirizine (XYZAL ) 5 MG tablet TAKE 1 TABLET BY MOUTH DAILY AS NEEDED FOR ALLERGIES.  . meloxicam  (MOBIC ) 15 mg, Oral, Daily  . Norethindrone-Ethinyl Estradiol-Fe Biphas (LO LOESTRIN FE ) 1 MG-10 MCG / 10 MCG tablet 1 tablet, Oral, Every evening  . [START ON 03/08/2024] phentermine  30 mg, Oral, BH-each morning  . [START ON 02/09/2024] phentermine  30 mg, Oral, BH-each morning  . phentermine  30 mg, Oral, BH-each morning  . topiramate  (TOPAMAX ) 50 mg, Oral, 2 times daily    Patient Active Problem List   Diagnosis Date Noted  . Elevated blood pressure reading 10/04/2022  . Chronic low back pain without sciatica 04/02/2022  . Mild intermittent asthma without complication 07/17/2021  . Class 2 severe obesity due to excess calories with serious comorbidity and body mass index (BMI) of 37.0 to 37.9 in adult 07/17/2021  . Acute recurrent pansinusitis 07/17/2021  . Sleep related headaches 07/17/2021  . Non-restorative sleep 07/17/2021  . Excessive daytime sleepiness 07/17/2021  . Loud snoring 07/17/2021  . Anti-TPO antibodies present 02/26/2021  .  Obesity (BMI 35.0-39.9 without comorbidity) 02/26/2021  . DOE (dyspnea on exertion) 01/20/2018  . Cough variant asthma  vs UACS  01/20/2018  . Seasonal allergies 08/03/2014  . Obesity 08/03/2014  . GERD (gastroesophageal reflux disease) 08/03/2014     ROS    Objective:     BP 120/68   Pulse 75   Temp 98 F (36.7 C) (Oral)   Ht 5' 1 (1.549 m)   Wt 189 lb 9.6 oz (86 kg)   LMP 01/05/2024 (Approximate)   SpO2 98%   BMI 35.82 kg/m  {Vitals History (Optional):23777}  Physical Exam   No results found for any visits on 01/12/24.  {Labs (Optional):23779}  The 10-year ASCVD risk score (Arnett DK, et al., 2019) is: 1.2%    Assessment & Plan:  Class 2 severe obesity due to excess calories with serious comorbidity and body mass index (BMI) of 37.0 to 37.9 in adult -     Comprehensive metabolic panel with GFR; Future -     TSH; Future  Class 2 severe obesity due to excess calories with serious comorbidity and body mass index (BMI) of 38.0 to 38.9 in adult -     Phentermine  HCl; Take 1 capsule (30 mg total) by mouth every morning.  Dispense: 30 capsule; Refill: 0 -     Phentermine  HCl; Take 1 capsule (30 mg total) by mouth every morning.  Dispense: 30 capsule; Refill: 0 -     Phentermine  HCl; Take 1 capsule (30 mg total) by mouth every morning.  Dispense: 30 capsule; Refill: 0  No follow-ups on file.    Heron CHRISTELLA Sharper, MD

## 2024-01-12 NOTE — Patient Instructions (Signed)
 Try Noom app to see if this helps with behavioral aspect.

## 2024-01-13 ENCOUNTER — Ambulatory Visit: Payer: Self-pay | Admitting: Family Medicine

## 2024-01-14 ENCOUNTER — Ambulatory Visit

## 2024-01-14 DIAGNOSIS — J309 Allergic rhinitis, unspecified: Secondary | ICD-10-CM

## 2024-01-31 ENCOUNTER — Other Ambulatory Visit: Payer: Self-pay | Admitting: Allergy

## 2024-02-12 ENCOUNTER — Ambulatory Visit

## 2024-02-16 DIAGNOSIS — J302 Other seasonal allergic rhinitis: Secondary | ICD-10-CM | POA: Diagnosis not present

## 2024-02-16 DIAGNOSIS — J3081 Allergic rhinitis due to animal (cat) (dog) hair and dander: Secondary | ICD-10-CM | POA: Diagnosis not present

## 2024-02-16 DIAGNOSIS — J301 Allergic rhinitis due to pollen: Secondary | ICD-10-CM | POA: Diagnosis not present

## 2024-02-16 DIAGNOSIS — J3089 Other allergic rhinitis: Secondary | ICD-10-CM | POA: Diagnosis not present

## 2024-02-16 NOTE — Progress Notes (Signed)
 VIALS MADE ON 02/16/24

## 2024-02-19 ENCOUNTER — Ambulatory Visit (INDEPENDENT_AMBULATORY_CARE_PROVIDER_SITE_OTHER)

## 2024-02-19 DIAGNOSIS — J309 Allergic rhinitis, unspecified: Secondary | ICD-10-CM | POA: Diagnosis not present

## 2024-02-27 ENCOUNTER — Ambulatory Visit (INDEPENDENT_AMBULATORY_CARE_PROVIDER_SITE_OTHER): Admitting: *Deleted

## 2024-02-27 DIAGNOSIS — J309 Allergic rhinitis, unspecified: Secondary | ICD-10-CM | POA: Diagnosis not present

## 2024-03-04 ENCOUNTER — Ambulatory Visit: Admitting: *Deleted

## 2024-03-04 DIAGNOSIS — J309 Allergic rhinitis, unspecified: Secondary | ICD-10-CM | POA: Diagnosis not present

## 2024-03-19 ENCOUNTER — Other Ambulatory Visit: Payer: Self-pay | Admitting: Family Medicine

## 2024-03-19 DIAGNOSIS — G8929 Other chronic pain: Secondary | ICD-10-CM

## 2024-04-01 ENCOUNTER — Ambulatory Visit

## 2024-04-01 DIAGNOSIS — J309 Allergic rhinitis, unspecified: Secondary | ICD-10-CM

## 2024-04-12 ENCOUNTER — Ambulatory Visit: Admitting: Family Medicine

## 2024-04-26 ENCOUNTER — Encounter: Payer: Self-pay | Admitting: Family Medicine

## 2024-04-26 ENCOUNTER — Ambulatory Visit: Admitting: Family Medicine

## 2024-04-26 VITALS — BP 112/70 | HR 80 | Temp 98.2°F | Ht 61.0 in | Wt 185.6 lb

## 2024-04-26 DIAGNOSIS — E66812 Obesity, class 2: Secondary | ICD-10-CM | POA: Diagnosis not present

## 2024-04-26 DIAGNOSIS — Z6838 Body mass index (BMI) 38.0-38.9, adult: Secondary | ICD-10-CM

## 2024-04-26 DIAGNOSIS — Z1231 Encounter for screening mammogram for malignant neoplasm of breast: Secondary | ICD-10-CM

## 2024-04-26 MED ORDER — PHENTERMINE HCL 30 MG PO CAPS: 30.0000 mg | ORAL_CAPSULE | ORAL | 0 refills | Status: AC

## 2024-04-26 MED ORDER — TOPIRAMATE 50 MG PO TABS: 50.0000 mg | ORAL_TABLET | Freq: Two times a day (BID) | ORAL | 1 refills | Status: AC

## 2024-04-26 NOTE — Progress Notes (Signed)
 "  Established Patient Office Visit  Subjective   Patient ID: Rebecca Simon. Rebecca Simon, female    DOB: Feb 10, 1976  Age: 49 y.o. MRN: [id number]  Chief Complaint  Patient presents with   Medical Management of Chronic Issues    HPI Discussed the use of AI scribe software for clinical note transcription with the patient, who gave verbal consent to proceed.  History of Present Illness   Rebecca Simon is a 49 year old female who presents for a follow-up visit for phentermine  refills.  She has lost 4 lb since her last visit in September. She takes phentermine  30 mg and topiramate  50 mg twice daily without side effects. She walks a few times per week but finds it harder to walk outside in cold weather. She has resistance bands at home. Her diet is low carb but she finds this difficult, especially around the holidays.  She uses Darryle for birth control and tolerates it better than prior options, though she still has some cramping. Insurance has limited access to her preferred low estrogen pill that previously relieved her cycle symptoms.  She has not had her mammogram this year because of insurance issues with her usual imaging site and her OB/GYN leaving. She usually has it done in December.       Current Outpatient Medications  Medication Instructions   Albuterol -Budesonide  (AIRSUPRA ) 90-80 MCG/ACT AERO 2 puffs, Inhalation, Every 4 hours PRN   Azelastine  HCl 137 MCG/SPRAY SOLN USE 2 SPRAYS IN EACH NOSTRIL TWICE DAILY AS DIRECTED   EPINEPHrine  (EPIPEN  2-PAK) 0.3 mg, Intramuscular, As needed   fluticasone  furoate-vilanterol (BREO ELLIPTA ) 100-25 MCG/ACT AEPB 1 puff, Inhalation, Daily   ipratropium (ATROVENT ) 0.06 % nasal spray 2 sprays, Each Nare, 3 times daily PRN   ipratropium (ATROVENT ) 0.06 % nasal spray PLACE 2 SPRAYS EACH NOSTRIL IN EVENING/BEDTIME AND CAN USE UP TO 3-4 TIMES A DAY IF NEEDED   levocetirizine (XYZAL ) 5 MG tablet TAKE 1 TABLET BY MOUTH DAILY AS NEEDED FOR ALLERGIES.   meloxicam   (MOBIC ) 15 mg, Oral, Daily   Norethindrone-Ethinyl Estradiol-Fe Biphas (LO LOESTRIN FE ) 1 MG-10 MCG / 10 MCG tablet 1 tablet, Oral, Every evening   phentermine  30 mg, Oral, BH-each morning   [START ON 05/24/2024] phentermine  30 mg, Oral, BH-each morning   [START ON 06/18/2024] phentermine  30 mg, Oral, BH-each morning   topiramate  (TOPAMAX ) 50 mg, Oral, 2 times daily    Patient Active Problem List   Diagnosis Date Noted   Elevated blood pressure reading 10/04/2022   Chronic low back pain without sciatica 04/02/2022   Mild intermittent asthma without complication 07/17/2021   Class 2 severe obesity due to excess calories with serious comorbidity and body mass index (BMI) of 37.0 to 37.9 in adult 07/17/2021   Acute recurrent pansinusitis 07/17/2021   Sleep related headaches 07/17/2021   Non-restorative sleep 07/17/2021   Excessive daytime sleepiness 07/17/2021   Loud snoring 07/17/2021   Anti-TPO antibodies present 02/26/2021   Obesity (BMI 35.0-39.9 without comorbidity) 02/26/2021   DOE (dyspnea on exertion) 01/20/2018   Cough variant asthma  vs UACS  01/20/2018   Seasonal allergies 08/03/2014   Obesity 08/03/2014   GERD (gastroesophageal reflux disease) 08/03/2014     Review of Systems  All other systems reviewed and are negative.     Objective:     BP 112/70   Pulse 80   Temp 98.2 F (36.8 C) (Oral)   Ht 5' 1 (1.549 m)   Wt 185 lb 9.6 oz (  84.2 kg)   LMP 03/29/2024 (Approximate)   SpO2 98%   BMI 35.07 kg/m    Physical Exam Vitals reviewed.  Constitutional:      Appearance: Normal appearance. She is well-groomed. She is obese.  Cardiovascular:     Rate and Rhythm: Normal rate and regular rhythm.     Heart sounds: S1 normal and S2 normal.  Pulmonary:     Effort: Pulmonary effort is normal.     Breath sounds: Normal breath sounds and air entry.  Abdominal:     General: Bowel sounds are normal.  Musculoskeletal:     Right lower leg: No edema.     Left lower leg:  No edema.  Neurological:     Mental Status: She is alert and oriented to person, place, and time. Mental status is at baseline.     Gait: Gait is intact.  Psychiatric:        Mood and Affect: Mood and affect normal.        Speech: Speech normal.        Behavior: Behavior normal.        Judgment: Judgment normal.      No results found for any visits on 04/26/24.    The 10-year ASCVD risk score (Arnett DK, et al., 2019) is: 0.9%    Assessment & Plan:  Class 2 severe obesity due to excess calories with serious comorbidity and body mass index (BMI) of 38.0 to 38.9 in adult -     Phentermine  HCl; Take 1 capsule (30 mg total) by mouth every morning.  Dispense: 30 capsule; Refill: 0 -     Phentermine  HCl; Take 1 capsule (30 mg total) by mouth every morning.  Dispense: 30 capsule; Refill: 0 -     Phentermine  HCl; Take 1 capsule (30 mg total) by mouth every morning.  Dispense: 30 capsule; Refill: 0 -     Topiramate ; Take 1 tablet (50 mg total) by mouth 2 (two) times daily.  Dispense: 180 tablet; Refill: 1  Breast cancer screening by mammogram -     3D Screening Mammogram, Left and Right; Future   Assessment and Plan    Class 1 obesity Weight loss of four pounds since September. Current regimen includes phentermine  and topiramate . No reported side effects from medications. Physical activity primarily consists of walking, but limited by cold weather. Diet remains low carbohydrate. - Continue phentermine  30 mg and topiramate  50 mg twice daily. - Encouraged indoor weight training exercises using resistance bands or free weights to increase muscle mass and boost metabolism. - Advised maintaining low carbohydrate diet.  Contraceptive management Current contraceptive is Darryle, which is better tolerated than previous options. Insurance issues with low estrogen contraceptive due to formulary restrictions. Previous attempts to obtain low estrogen contraceptive were unsuccessful due to insurance  not covering it. - Instructed patient to ask pharmacist to send a new prior authorization for low estrogen contraceptive. - Continue current contraceptive, Darryle, as it is better tolerated.  General Health Maintenance Mammogram not completed due to insurance issues with previous provider. - Ordered mammogram at the breast center, which accepts all insurance.        Return in about 3 months (around 07/25/2024) for weight loss.    Heron CHRISTELLA Sharper, MD "

## 2024-05-04 ENCOUNTER — Other Ambulatory Visit: Payer: Self-pay | Admitting: Allergy

## 2024-05-06 ENCOUNTER — Ambulatory Visit

## 2024-05-06 DIAGNOSIS — J302 Other seasonal allergic rhinitis: Secondary | ICD-10-CM

## 2024-05-07 ENCOUNTER — Ambulatory Visit: Admitting: Allergy

## 2024-07-26 ENCOUNTER — Ambulatory Visit: Admitting: Family Medicine

## 2024-08-04 ENCOUNTER — Ambulatory Visit: Admitting: Allergy
